# Patient Record
Sex: Female | Born: 1985 | Race: Black or African American | Hispanic: No | Marital: Married | Smoking: Never smoker
Health system: Southern US, Community
[De-identification: ages and names within clinical notes are randomized; demographics above are authoritative.]

## PROBLEM LIST (undated history)

## (undated) ENCOUNTER — Inpatient Hospital Stay (HOSPITAL_COMMUNITY): Payer: Self-pay

---

## 2015-06-23 ENCOUNTER — Inpatient Hospital Stay (HOSPITAL_COMMUNITY)
Admission: AD | Admit: 2015-06-23 | Discharge: 2015-06-23 | Disposition: A | Discharge status: Home / Self Care | Payer: Commercial Managed Care - PPO | Source: Ambulatory Visit | Attending: Obstetrics & Gynecology | Admitting: Obstetrics & Gynecology

## 2015-06-23 ENCOUNTER — Ambulatory Visit (INDEPENDENT_AMBULATORY_CARE_PROVIDER_SITE_OTHER): Payer: Commercial Managed Care - PPO | Admitting: Family Medicine

## 2015-06-23 ENCOUNTER — Encounter (HOSPITAL_COMMUNITY): Payer: Self-pay | Admitting: *Deleted

## 2015-06-23 ENCOUNTER — Inpatient Hospital Stay (HOSPITAL_COMMUNITY): Payer: Commercial Managed Care - PPO

## 2015-06-23 VITALS — BP 130/80 | HR 84 | Temp 98.5°F | Resp 16 | Ht 66.0 in | Wt 140.6 lb

## 2015-06-23 DIAGNOSIS — O26891 Other specified pregnancy related conditions, first trimester: Secondary | ICD-10-CM | POA: Insufficient documentation

## 2015-06-23 DIAGNOSIS — N831 Corpus luteum cyst: Secondary | ICD-10-CM | POA: Diagnosis not present

## 2015-06-23 DIAGNOSIS — O209 Hemorrhage in early pregnancy, unspecified: Secondary | ICD-10-CM | POA: Diagnosis not present

## 2015-06-23 DIAGNOSIS — O4690 Antepartum hemorrhage, unspecified, unspecified trimester: Secondary | ICD-10-CM

## 2015-06-23 DIAGNOSIS — R1084 Generalized abdominal pain: Secondary | ICD-10-CM

## 2015-06-23 DIAGNOSIS — Z3A01 Less than 8 weeks gestation of pregnancy: Secondary | ICD-10-CM | POA: Diagnosis not present

## 2015-06-23 DIAGNOSIS — O4691 Antepartum hemorrhage, unspecified, first trimester: Secondary | ICD-10-CM

## 2015-06-23 DIAGNOSIS — O3481 Maternal care for other abnormalities of pelvic organs, first trimester: Secondary | ICD-10-CM | POA: Diagnosis not present

## 2015-06-23 DIAGNOSIS — N760 Acute vaginitis: Secondary | ICD-10-CM | POA: Diagnosis not present

## 2015-06-23 DIAGNOSIS — A499 Bacterial infection, unspecified: Secondary | ICD-10-CM | POA: Diagnosis not present

## 2015-06-23 DIAGNOSIS — R109 Unspecified abdominal pain: Secondary | ICD-10-CM | POA: Insufficient documentation

## 2015-06-23 DIAGNOSIS — R102 Pelvic and perineal pain: Secondary | ICD-10-CM

## 2015-06-23 DIAGNOSIS — B9689 Other specified bacterial agents as the cause of diseases classified elsewhere: Secondary | ICD-10-CM

## 2015-06-23 LAB — POCT URINALYSIS DIP (MANUAL ENTRY)
Bilirubin, UA: NEGATIVE
Glucose, UA: NEGATIVE
Leukocytes, UA: NEGATIVE
NITRITE UA: NEGATIVE
SPEC GRAV UA: 1.025
UROBILINOGEN UA: 1
pH, UA: 6.5

## 2015-06-23 LAB — POCT CBC
GRANULOCYTE PERCENT: 71.3 % (ref 37–80)
HCT, POC: 41.9 % (ref 37.7–47.9)
Hemoglobin: 13.2 g/dL (ref 12.2–16.2)
LYMPH, POC: 1.6 (ref 0.6–3.4)
MCH, POC: 25.6 pg — AB (ref 27–31.2)
MCHC: 31.6 g/dL — AB (ref 31.8–35.4)
MCV: 81.2 fL (ref 80–97)
MID (cbc): 0.2 (ref 0–0.9)
MPV: 8.3 fL (ref 0–99.8)
PLATELET COUNT, POC: 219 10*3/uL (ref 142–424)
POC Granulocyte: 4.4 (ref 2–6.9)
POC LYMPH %: 25.4 % (ref 10–50)
POC MID %: 3.3 %M (ref 0–12)
RBC: 5.16 M/uL (ref 4.04–5.48)
RDW, POC: 12.9 %
WBC: 6.2 10*3/uL (ref 4.6–10.2)

## 2015-06-23 LAB — POCT WET + KOH PREP
TRICH BY WET PREP: ABSENT
Yeast by KOH: ABSENT
Yeast by wet prep: ABSENT

## 2015-06-23 LAB — POC MICROSCOPIC URINALYSIS (UMFC)

## 2015-06-23 LAB — POCT URINE PREGNANCY: Preg Test, Ur: POSITIVE — AB

## 2015-06-23 LAB — HCG, QUANTITATIVE, PREGNANCY: HCG, BETA CHAIN, QUANT, S: 66076.4 m[IU]/mL

## 2015-06-23 MED ORDER — PRENATAL MULTIVITAMIN + DHA 28-0.8 & 200 MG PO MISC: 1.0000 | Freq: Every day | ORAL | Status: DC

## 2015-06-23 MED ORDER — METRONIDAZOLE 500 MG PO TABS: 500.0000 mg | ORAL_TABLET | Freq: Two times a day (BID) | ORAL | Status: DC

## 2015-06-23 MED ORDER — METOCLOPRAMIDE HCL 5 MG PO TABS
5.0000 mg | ORAL_TABLET | Freq: Four times a day (QID) | ORAL | Status: DC | PRN
Start: 2015-06-23 — End: 2016-02-22

## 2015-06-23 NOTE — Patient Instructions (Addendum)
  [facility] ER [address] [place], [place] [address]    I am concerned that you could have a tubal pregnancy, or an ectopic pregnancy. Therefore, it would be best for you to go to the [facility] ER so you can get an ultrasound of your uterus to see if the pregnancy is in the uterus or not. If this is not a tubal pregnancy, then I am concerned you could have an infection in your uterus.  I have talked to the ER doctor and charge nurse, and they are expecting you and are aware of the situation.  They should give you a taxi voucher home.

## 2015-06-23 NOTE — MAU Note (Signed)
Pt here for ultrasound to determine if pregnancy is ectopic. Pain on her left side only. Rates pain 7/10.

## 2015-06-23 NOTE — Discharge Instructions (Signed)
Your ultrasound shows that you have a 6 week 1 day pregnancy with a heart beat. You have a cyst on the left ovary that may cause some pain. Start your prenatal care and prenatal vitamins. Return if the bleeding becomes heavy or you have other problems.

## 2015-06-23 NOTE — Progress Notes (Deleted)
Patient ID: Veronica Garrett, female   DOB: 01-Oct-1985, 29 y.o.   MRN: [id number]

## 2015-06-23 NOTE — MAU Provider Note (Signed)
Veronica Garrett is a 29 y.o. G1P0 @ [redacted]w[redacted]d who was evaluated at Urgent Care for abdominal pain and bleeding in early pregnancy and sent to MAU for ultrasound to rule out ectopic.  See note from Urgent Care Provider in [facility].   Language line used to translate because patient speaks [place].   BP 131/84 mmHg  Pulse 74  Temp(Src) 98.2 F (36.8 C) (Oral)  Resp 16  Ht  (1.651 m)  Wt 139 lb 6 oz (63.22 kg)  BMI 23.19 kg/m2  LMP 05/11/2015   Patient is alert and oriented, she appears well hydrated and is in NAD.  She has normal breathing effort and normal heart rate.   US Ob Comp Less 14 Wks  06/23/2015   CLINICAL DATA:  Mid pelvic pain for 1 week  EXAM: OBSTETRIC <14 WK [place] AND TRANSVAGINAL OB US  TECHNIQUE: Both transabdominal and transvaginal ultrasound examinations were performed for complete evaluation of the gestation as well as the maternal uterus, adnexal regions, and pelvic cul-de-sac. Transvaginal technique was performed to assess early pregnancy.  COMPARISON:  None.  FINDINGS: Intrauterine gestational sac: Visualized/normal in shape.  Yolk sac:  Present  Embryo:  Present  Cardiac Activity: Present  Heart Rate: 114  bpm  CRL:  4.6  mm   6 w   1 d                  [place] EDC: 02/15/2016  Maternal uterus/adnexae: Small focus of decreased echogenicity is noted adjacent to the gestational sac consistent with a small subchorionic hemorrhage. The ovaries are within normal limits with a corpus luteum cyst on the left measuring 1.8 cm.  IMPRESSION: Single live intrauterine gestation at 6 weeks 1 day. Small subchorionic hemorrhage. Followup is recommended as clinically indicated.   Electronically Signed   By: Alcide Clever M.D.   On: 06/23/2015 17:59   US Ob Transvaginal  06/23/2015   CLINICAL DATA:  Mid pelvic pain for 1 week  EXAM: OBSTETRIC <14 WK [place] AND TRANSVAGINAL OB US  TECHNIQUE: Both transabdominal and transvaginal ultrasound examinations were performed for complete evaluation of the  gestation as well as the maternal uterus, adnexal regions, and pelvic cul-de-sac. Transvaginal technique was performed to assess early pregnancy.  COMPARISON:  None.  FINDINGS: Intrauterine gestational sac: Visualized/normal in shape.  Yolk sac:  Present  Embryo:  Present  Cardiac Activity: Present  Heart Rate: 114  bpm  CRL:  4.6  mm   6 w   1 d                  [place] EDC: 02/15/2016  Maternal uterus/adnexae: Small focus of decreased echogenicity is noted adjacent to the gestational sac consistent with a small subchorionic hemorrhage. The ovaries are within normal limits with a corpus luteum cyst on the left measuring 1.8 cm.  IMPRESSION: Single live intrauterine gestation at 6 weeks 1 day. Small subchorionic hemorrhage. Followup is recommended as clinically indicated.   Electronically Signed   By: Alcide Clever M.D.   On: 06/23/2015 17:59    A/P 29 y.o. female @ 6.[redacted] weeks gestation with vaginal spotting and left side pain stable for d/c with viable IUP. Discussed in detail with the patient using the [place] language line translator ultrasound findings and plan of care.  IUP @ 6wk. 1 day and Left CLC. She will start her prenatal care and return as needed for any problems.

## 2015-06-23 NOTE — Progress Notes (Signed)
Subjective:    Patient ID: Veronica Garrett, female    DOB: [birth year], 29 y.o.   MRN: [id number] This chart was scribed for Veronica Sorenson, MD by Littie Deeds, Medical Scribe. This patient was seen in Room 11 and the patient's care was started at 2:08 PM.   Chief Complaint  Patient presents with  . Abdominal Pain  . Nausea    HPI HPI Comments: Veronica Garrett is a 29 y.o. female who [facility] complaining of constant mid abdominal pain that started 1 week ago. The pain is not a burning or stabbing pain. The pain is not worse with movement or eating. Patient also reports having associated nausea and vomiting from 7:00 AM to 1:00 PM. She has been unable to eat so she has been trying to drink fluids. She also notes having recurrent vaginal spotting for the past several days, mainly seen on toilet paper after urination. She has not tried any medications. Patient denies urinary symptoms. She has a distant history of pregnancy many years ago. She is planning to keep the pregnancy, but needs to discuss with the father of the baby who is not present today.  Patient's primary language is [place]. An interpreter was used to translate the history.  History reviewed. No pertinent past medical history. History reviewed. No pertinent past surgical history. No current outpatient prescriptions on file prior to visit.   No current facility-administered medications on file prior to visit.   No Known Allergies History reviewed. No pertinent family history. Social History   Social History  . Marital Status: Single    Spouse Name: N/A  . Number of Children: N/A  . Years of Education: N/A   Social History Main Topics  . Smoking status: Never Smoker   . Smokeless tobacco: None  . Alcohol Use: None  . Drug Use: None  . Sexual Activity: Not Asked   Other Topics Concern  . None   Social History Narrative  . None    Review of Systems  Gastrointestinal: Positive  for nausea, vomiting and abdominal pain.  Genitourinary: Positive for vaginal bleeding. Negative for dysuria, frequency and difficulty urinating.       Objective:  BP 130/80 mmHg  Pulse 84  Temp(Src) 98.5 F (36.9 C) (Oral)  Resp 16  Ht  (1.676 m)  Wt 140 lb 9.6 oz (63.776 kg)  BMI 22.70 kg/m2  SpO2 99%  LMP 05/11/2015  Physical Exam  Constitutional: She is oriented to person, place, and time. She appears well-developed and well-nourished. No distress.  HENT:  Head: Normocephalic and atraumatic.  Mouth/Throat: Oropharynx is clear and moist. No oropharyngeal exudate.  Eyes: Pupils are equal, round, and reactive to light.  Neck: Neck supple.  Cardiovascular: Normal rate.   Pulmonary/Chest: Effort normal.  Abdominal: Soft. Bowel sounds are normal. She exhibits no distension. There is tenderness in the left lower quadrant. There is guarding. There is no rebound.  Positive bowel sounds. LLQ tenderness with guarding. No referred pain.  Genitourinary: Uterus is tender. Uterus is not enlarged. Cervix exhibits motion tenderness. Left adnexum displays tenderness.  Severe pain with pelvic exam. Severe cervical motion tenderness. Tenderness over the uterus and left adnexa. Cervix appears pale with slightly bluish hue. Os closed. Tinge of thin clear mucoid discharge tinged with blood along vaginal walls, none seen on from cervix. No sign of bleeding from os. Uterus does not feel enlarged, no masses palpable.   Musculoskeletal: She exhibits no edema.  Neurological: She is alert and oriented to person, place, and time. No cranial nerve deficit.  Skin: Skin is warm and dry. No rash noted.  Psychiatric: She has a normal mood and affect. Her behavior is normal.  Nursing note and vitals reviewed.      Results for orders placed or performed in visit on 06/23/15  POCT urine pregnancy  Result Value Ref Range   Preg Test, Ur Positive (A) Negative  POCT urinalysis dipstick  Result Value Ref  Range   Color, UA brown (A) yellow   Clarity, UA clear clear   Glucose, UA negative negative   Bilirubin, UA negative negative   Ketones, POC UA small (15) (A) negative   Spec Grav, UA 1.025    Blood, UA trace-intact (A) negative   pH, UA 6.5    Protein Ur, POC =30 (A) negative   Urobilinogen, UA 1.0    Nitrite, UA Negative Negative   Leukocytes, UA Negative Negative  POCT Microscopic Urinalysis ([facility])  Result Value Ref Range   WBC,UR,HPF,POC Few (A) None WBC/hpf   RBC,UR,HPF,POC Few (A) None RBC/hpf   Bacteria Few (A) None   Mucus Present (A) Absent   Epithelial Cells, UR Per Microscopy Few (A) None cells/hpf  POCT Wet + KOH Prep ([facility])  Result Value Ref Range   Yeast by KOH Absent Present, Absent   Yeast by wet prep Absent Present, Absent   WBC by wet prep Few None, Few   Clue Cells Wet Prep HPF POC Moderate (A) None   Trich by wet prep Absent Present, Absent   Bacteria Wet Prep HPF POC Many (A) None, Few   Epithelial Cells By [employer] Pref ([facility]) Many (A) None, Few   RBC,UR,HPF,POC Moderate (A) None RBC/hpf  POCT CBC  Result Value Ref Range   WBC 6.2 4.6 - 10.2 K/uL   Lymph, poc 1.6 0.6 - 3.4   POC LYMPH PERCENT 25.4 10 - 50 %L   MID (cbc) 0.2 0 - 0.9   POC MID % 3.3 0 - 12 %M   POC Granulocyte 4.4 2 - 6.9   Granulocyte percent 71.3 37 - 80 %G   RBC 5.16 4.04 - 5.48 M/uL   Hemoglobin 13.2 12.2 - 16.2 g/dL   HCT, POC 40.9 81.1 - 47.9 %   MCV 81.2 80 - 97 fL   MCH, POC 25.6 (A) 27 - 31.2 pg   MCHC 31.6 (A) 31.8 - 35.4 g/dL   RDW, POC 91.4 %   Platelet Count, POC 219 142 - 424 K/uL   MPV 8.3 0 - 99.8 fL     Assessment & Plan:   1. Generalized abdominal pain   2. Pelvic pain affecting pregnancy in first trimester, antepartum - concern for tubal preg - need pelvic US stat - tried to arrange o/p but couldn't get [place] tech to call us back so will send in to ER - discussed w/ charge nurse and EDP - taxi called and paid for - pt will need taxi voucher home. Pt is kept NPO in  case she needs any urgent eval.  GC/Chlam and serum quant hcg P.   3. Bacterial vaginosis - gave rx for BV  4.      Dehydration - from n/v of pregnancy - gave rx for reglan, push fluids  Pt speak [place]. Exam was conducted with [employer]  Orders Placed This Encounter  Procedures  . GC/Chlamydia Probe Amp  . US OB Transvaginal    Standing  Status: Future     Number of Occurrences:      Standing Expiration Date: 08/22/2016    Order Specific Question:  Reason for Exam (SYMPTOM  OR DIAGNOSIS REQUIRED)    Answer:  severe cervical/uterine/left adnexa pain    Order Specific Question:  Preferred imaging location?    Answer:  [facility]  . US Pelvis Limited    Standing Status: Future     Number of Occurrences:      Standing Expiration Date: 08/22/2016    Order Specific Question:  Reason for Exam (SYMPTOM  OR DIAGNOSIS REQUIRED)    Answer:  severe cervical/uterine/left adnexa pain    Order Specific Question:  Preferred imaging location?    Answer:  [facility]  . US Pelvis Complete    Standing Status: Future     Number of Occurrences:      Standing Expiration Date: 08/22/2016    Order Specific Question:  Reason for Exam (SYMPTOM  OR DIAGNOSIS REQUIRED)    Answer:  pelvic pain/ positive HCG    Order Specific Question:  Preferred imaging location?    Answer:  [facility]  . hCG, quantitative, pregnancy  . POCT urine pregnancy  . POCT urinalysis dipstick  . POCT Microscopic Urinalysis ([facility])  . POCT Wet + KOH Prep ([facility])  . POCT CBC    Meds ordered this encounter  Medications  . Prenatal MV-Min-Fe Fum-FA-DHA (PRENATAL MULTIVITAMIN + DHA) 28-0.8 & 200 MG MISC    Sig: Take 1 tablet by mouth daily.    Dispense:  120 each    Refill:  3  . metoCLOPramide (REGLAN) 5 MG tablet    Sig: Take 1-2 tablets (5-10 mg total) by mouth every 6 (six) hours as needed for nausea or vomiting.    Dispense:  40 tablet    Refill:  1  . metroNIDAZOLE (FLAGYL) 500 MG tablet     Sig: Take 1 tablet (500 mg total) by mouth 2 (two) times daily.    Dispense:  14 tablet    Refill:  0    I personally performed the services described in this documentation, which was scribed in my presence. The recorded information has been reviewed and considered, and addended by me as needed.  Veronica Sorenson, MD MPH   By signing my name below, I, Littie Deeds, attest that this documentation has been prepared under the direction and in the presence of Veronica Sorenson, MD.  Electronically Signed: Littie Deeds, Medical Scribe. 06/23/2015. 2:08 PM.

## 2015-06-25 ENCOUNTER — Encounter: Payer: Self-pay | Admitting: Family Medicine

## 2015-06-25 LAB — GC/CHLAMYDIA PROBE AMP
CT Probe RNA: NEGATIVE
GC PROBE AMP APTIMA: NEGATIVE

## 2015-07-24 ENCOUNTER — Encounter (HOSPITAL_COMMUNITY): Payer: Self-pay | Admitting: *Deleted

## 2015-07-24 ENCOUNTER — Inpatient Hospital Stay (HOSPITAL_COMMUNITY)
Admission: AD | Admit: 2015-07-24 | Discharge: 2015-07-24 | Disposition: A | Discharge status: Home / Self Care | Payer: Commercial Managed Care - PPO | Source: Ambulatory Visit | Attending: Family Medicine | Admitting: Family Medicine

## 2015-07-24 DIAGNOSIS — O209 Hemorrhage in early pregnancy, unspecified: Secondary | ICD-10-CM | POA: Diagnosis present

## 2015-07-24 DIAGNOSIS — Z3A10 10 weeks gestation of pregnancy: Secondary | ICD-10-CM | POA: Insufficient documentation

## 2015-07-24 DIAGNOSIS — R1084 Generalized abdominal pain: Secondary | ICD-10-CM

## 2015-07-24 DIAGNOSIS — R109 Unspecified abdominal pain: Secondary | ICD-10-CM | POA: Diagnosis not present

## 2015-07-24 DIAGNOSIS — Z3491 Encounter for supervision of normal pregnancy, unspecified, first trimester: Secondary | ICD-10-CM

## 2015-07-24 LAB — URINALYSIS, ROUTINE W REFLEX MICROSCOPIC
Bilirubin Urine: NEGATIVE
GLUCOSE, UA: NEGATIVE mg/dL
Ketones, ur: 15 mg/dL — AB
LEUKOCYTES UA: NEGATIVE
Nitrite: NEGATIVE
PH: 6 (ref 5.0–8.0)
Protein, ur: NEGATIVE mg/dL
Urobilinogen, UA: 0.2 mg/dL (ref 0.0–1.0)

## 2015-07-24 LAB — URINE MICROSCOPIC-ADD ON

## 2015-07-24 NOTE — Discharge Instructions (Signed)
Prenatal Care °WHAT IS PRENATAL CARE?  °Prenatal care is the process of caring for a pregnant woman before she gives birth. Prenatal care makes sure that she and her baby remain as healthy as possible throughout pregnancy. Prenatal care may be provided by a midwife, family practice health care provider, or a childbirth and pregnancy specialist (obstetrician). Prenatal care may include physical examinations, testing, treatments, and education on nutrition, lifestyle, and social support services. °WHY IS PRENATAL CARE SO IMPORTANT?  °Early and consistent prenatal care increases the chance that you and your baby will remain healthy throughout your pregnancy. This type of care also decreases a baby's risk of being born too early (prematurely), or being born smaller than expected (small for gestational age). Any underlying medical conditions you may have that could pose a risk during your pregnancy are discussed during prenatal care visits. You will also be monitored regularly for any new conditions that may arise during your pregnancy so they can be treated quickly and effectively. °WHAT HAPPENS DURING PRENATAL CARE VISITS? °Prenatal care visits may include the following: °Discussion °Tell your health care provider about any new signs or symptoms you have experienced since your last visit. These might include: °· Nausea or vomiting. °· Increased or decreased level of energy. °· Difficulty sleeping. °· Back or leg pain. °· Weight changes. °· Frequent urination. °· Shortness of breath with physical activity. °· Changes in your skin, such as the development of a rash or itchiness. °· Vaginal discharge or bleeding. °· Feelings of excitement or nervousness. °· Changes in your baby's movements. °You may want to write down any questions or topics you want to discuss with your health care provider and bring them with you to your appointment. °Examination °During your first prenatal care visit, you will likely have a complete  physical exam. Your health care provider will often examine your vagina, cervix, and the position of your uterus, as well as check your heart, lungs, and other body systems. As your pregnancy progresses, your health care provider will measure the size of your uterus and your baby's position inside your uterus. He or she may also examine you for early signs of labor. Your prenatal visits may also include checking your blood pressure and, after about 10-12 weeks of pregnancy, listening to your baby's heartbeat. °Testing °Regular testing often includes: °· Urinalysis. This checks your urine for glucose, protein, or signs of infection. °· Blood count. This checks the levels of white and red blood cells in your body. °· Tests for sexually transmitted infections (STIs). Testing for STIs at the beginning of pregnancy is routinely done and is required in many states. °· Antibody testing. You will be checked to see if you are immune to certain illnesses, such as rubella, that can affect a developing fetus. °· Glucose screen. Around 24-28 weeks of pregnancy, your blood glucose level will be checked for signs of gestational diabetes. Follow-up tests may be recommended. °· Group B strep. This is a bacteria that is commonly found inside a woman's vagina. This test will inform your health care provider if you need an antibiotic to reduce the amount of this bacteria in your body prior to labor and childbirth. °· Ultrasound. Many pregnant women undergo an ultrasound screening around 18-20 weeks of pregnancy to evaluate the health of the fetus and check for any developmental abnormalities. °· HIV (human immunodeficiency virus) testing. Early in your pregnancy, you will be screened for HIV. If you are at high risk for HIV, this test   may be repeated during your third trimester of pregnancy. °You may be offered other testing based on your age, personal or family medical history, or other factors.  °HOW OFTEN SHOULD I PLAN TO SEE MY  HEALTH CARE PROVIDER FOR PRENATAL CARE? °Your prenatal care check-up schedule depends on any medical conditions you have before, or develop during, your pregnancy. If you do not have any underlying medical conditions, you will likely be seen for checkups: °· Monthly, during the first 6 months of pregnancy. °· Twice a month during months 7 and 8 of pregnancy. °· Weekly starting in the 9th month of pregnancy and until delivery. °If you develop signs of early labor or other concerning signs or symptoms, you may need to see your health care provider more often. Ask your health care provider what prenatal care schedule is best for you. °WHAT CAN I DO TO KEEP MYSELF AND MY BABY AS HEALTHY AS POSSIBLE DURING MY PREGNANCY? °· Take a prenatal vitamin containing 400 micrograms (0.4 mg) of folic acid every day. Your health care provider may also ask you to take additional vitamins such as iodine, vitamin D, iron, copper, and zinc. °· Take 1500-2000 mg of calcium daily starting at your 20th week of pregnancy until you deliver your baby. °· Make sure you are up to date on your vaccinations. Unless directed otherwise by your health care provider: °¨ You should receive a tetanus, diphtheria, and pertussis (Tdap) vaccination between the 27th and 36th week of your pregnancy, regardless of when your last Tdap immunization occurred. This helps protect your baby from whooping cough (pertussis) after he or she is born. °¨ You should receive an annual inactivated influenza vaccine (IIV) to help protect you and your baby from influenza. This can be done at any point during your pregnancy. °· Eat a well-rounded diet that includes: °¨ Fresh fruits and vegetables. °¨ Lean proteins. °¨ Calcium-rich foods such as milk, yogurt, hard cheeses, and dark, leafy greens. °¨ Whole grain breads. °· Do not eat seafood high in mercury, including: °¨ Swordfish. °¨ Tilefish. °¨ Shark. °¨ King mackerel. °¨ More than 6 oz tuna per week. °· Do not eat: °¨ Raw  or undercooked meats or eggs. °¨ Unpasteurized foods, such as soft cheeses (brie, blue, or feta), juices, and milks. °¨ Lunch meats. °¨ Hot dogs that have not been heated until they are steaming. °· Drink enough water to keep your urine clear or pale yellow. For many women, this may be 10 or more 8 oz glasses of water each day. Keeping yourself hydrated helps deliver nutrients to your baby and may prevent the start of pre-term uterine contractions. °· Do not use any tobacco products including cigarettes, chewing tobacco, or electronic cigarettes. If you need help quitting, ask your health care provider. °· Do not drink beverages containing alcohol. No safe level of alcohol consumption during pregnancy has been determined. °· Do not use any illegal drugs. These can harm your developing baby or cause a miscarriage. °· Ask your health care provider or pharmacist before taking any prescription or over-the-counter medicines, herbs, or supplements. °· Limit your caffeine intake to no more than 200 mg per day. °· Exercise. Unless told otherwise by your health care provider, try to get 30 minutes of moderate exercise most days of the week. Do not  do high-impact activities, contact sports, or activities with a high risk of falling, such as horseback riding or downhill skiing. °· Get plenty of rest. °· Avoid anything that raises your   body temperature, such as hot tubs and saunas. °· If you own a cat, do not empty its litter box. Bacteria contained in cat feces can cause an infection called toxoplasmosis. This can result in serious harm to the fetus. °· Stay away from chemicals such as insecticides, lead, mercury, and cleaning or paint products that contain solvents. °· Do not have any X-rays taken unless medically necessary. °· Take a childbirth and breastfeeding preparation class. Ask your health care provider if you need a referral or recommendation. °  °This information is not intended to replace advice given to you by  your health care provider. Make sure you discuss any questions you have with your health care provider. °  °Document Released: 09/17/2003 Document Revised: 10/05/2014 Document Reviewed: 11/29/2013 °Elsevier Interactive Patient Education ©2016 [employer] ° °

## 2015-07-24 NOTE — MAU Note (Addendum)
Seen for vaginal bleeding and cramping in MAU on 06/23/15 and is back for check-up; given rxs for reglan,flagyl &  prenatal vitamin; does not have an OB yet; pt is spitting today and c/o of mild cramping that started this past Saturday; translator needed for communication;

## 2015-07-24 NOTE — MAU Provider Note (Signed)
History     CSN: [id number]  Arrival date and time: 07/24/15 1107   First Provider Initiated Contact with Patient 07/24/15 1203      Chief Complaint  Patient presents with  . Abdominal Pain   HPI Veronica Garrett 29 y.o. G1P0 @[redacted]w[redacted]d  presents for evaluation of pregnancy.  She has no new complaints and reports the pain she previously had is some better.  She denies fever, weakness, vaginal bleeding, dysuria.  She was concerned after hearing her U/S report from last visit (a month ago) that she should follow up.  [facility].  She does not know where to go.  She has used her medications provided at last visit correctly: Metronidazole, Reglan and PNV.   OB History    Gravida Para Term Preterm AB TAB SAB Ectopic Multiple Living   1               History reviewed. No pertinent past medical history.  History reviewed. No pertinent past surgical history.  Family History  Problem Relation Age of Onset  . Alcohol abuse Neg Hx   . Asthma Neg Hx   . Arthritis Neg Hx   . Birth defects Neg Hx   . Cancer Neg Hx   . COPD Neg Hx   . Depression Neg Hx   . Diabetes Neg Hx   . Drug abuse Neg Hx   . Early death Neg Hx   . Hearing loss Neg Hx   . Heart disease Neg Hx   . Hyperlipidemia Neg Hx   . Hypertension Neg Hx   . Kidney disease Neg Hx   . Learning disabilities Neg Hx   . Mental illness Neg Hx   . Mental retardation Neg Hx   . Miscarriages / Stillbirths Neg Hx   . Vision loss Neg Hx   . Varicose Veins Neg Hx   . Stroke Neg Hx     Social History  Substance Use Topics  . Smoking status: Never Smoker   . Smokeless tobacco: None  . Alcohol Use: None    Allergies: No Known Allergies  Prescriptions prior to admission  Medication Sig Dispense Refill Last Dose  . metoCLOPramide (REGLAN) 5 MG tablet Take 1-2 tablets (5-10 mg total) by mouth every 6 (six) hours as needed for nausea or vomiting. 40 tablet 1   . metroNIDAZOLE (FLAGYL) 500 MG tablet Take 1 tablet  (500 mg total) by mouth 2 (two) times daily. 14 tablet 0   . Prenatal MV-Min-Fe Fum-FA-DHA (PRENATAL MULTIVITAMIN + DHA) 28-0.8 & 200 MG MISC Take 1 tablet by mouth daily. 120 each 3     ROS Pertinent ROS in HPI.  All other systems are negative.   Physical Exam   Blood pressure 133/73, pulse 93, temperature 98 F (36.7 C), temperature source Oral, resp. rate 18, height 5\' 5"  (1.651 m), weight 140 lb (63.504 kg), last menstrual period 05/11/2015.  Physical Exam  Constitutional: She is oriented to person, place, and time. She appears well-developed and well-nourished. No distress.  Eyes: Conjunctivae and EOM are normal.  Cardiovascular: Normal rate.   Respiratory: Effort normal. No respiratory distress.  Musculoskeletal: Normal range of motion. She exhibits no edema.  Neurological: She is alert and oriented to person, place, and time.  Skin: Skin is warm and dry.  Psychiatric: She has a normal mood and affect. Her behavior is normal.    MAU Course  Procedures  MDM U/S last month with IUP.  NO  bleeding, and pain previously reported is improved.  Pt states no new complaint.   No need for emergent workup  Assessment and Plan  A:  1. First trimester pregnancy    P: Discharge to home Continue PNV Obtain [facility] asap - list of providers given to pt Precautione given - return for severe pain, vaginal bleeding, etc.   Patient may return to MAU as needed or if her condition were to change or worsen Visit conducted with [place] Interpreter from the language line.  Pt indicates good understanding and all questions answered.  Duane Boston Clark 07/24/2015, 12:13 PM

## 2015-07-24 NOTE — MAU Note (Signed)
Pt presents to MAU with complaints of lower abdominal pain since yesterday. Denies any vaginal bleeding

## 2015-09-12 LAB — OB RESULTS CONSOLE HEPATITIS B SURFACE ANTIGEN: Hepatitis B Surface Ag: NEGATIVE

## 2015-09-12 LAB — OB RESULTS CONSOLE ABO/RH: RH TYPE: POSITIVE

## 2015-09-12 LAB — OB RESULTS CONSOLE RUBELLA ANTIBODY, IGM: Rubella: IMMUNE

## 2015-09-12 LAB — OB RESULTS CONSOLE GC/CHLAMYDIA
CHLAMYDIA, DNA PROBE: NEGATIVE
GC PROBE AMP, GENITAL: NEGATIVE

## 2015-09-12 LAB — OB RESULTS CONSOLE ANTIBODY SCREEN: Antibody Screen: NEGATIVE

## 2015-09-12 LAB — OB RESULTS CONSOLE HIV ANTIBODY (ROUTINE TESTING): HIV: NONREACTIVE

## 2015-09-12 LAB — OB RESULTS CONSOLE RPR: RPR: NONREACTIVE

## 2015-09-29 NOTE — L&D Delivery Note (Signed)
Delivery Note At 12:39 PM a viable female was delivered via Vaginal, Spontaneous Delivery (Presentation:vertex ;Left Occiput Anterior).  APGAR: 8, 9; weight  .   Placenta status:spont ,vis shultz .  Cord:3vc  with the following complications:mild shoulder dystocia resolved with Lysle DingwallMc Roberts and superpubic pressure. Event lasted 30 second. .  Cord pH: pending  Anesthesia: None  Episiotomy:  none Lacerations:  none Suture Repair: none Est. Blood Loss 100(mL):    Mom to postpartum.  Baby to Couplet care / Skin to Skin.  Veronica Garrett 02/23/2016, 12:54 PM

## 2016-01-20 LAB — OB RESULTS CONSOLE GBS: GBS: POSITIVE

## 2016-01-20 LAB — OB RESULTS CONSOLE GC/CHLAMYDIA
Chlamydia: NEGATIVE
Gonorrhea: NEGATIVE

## 2016-02-17 ENCOUNTER — Telehealth (HOSPITAL_COMMUNITY): Payer: Self-pay | Admitting: *Deleted

## 2016-02-17 ENCOUNTER — Encounter (HOSPITAL_COMMUNITY): Payer: Self-pay | Admitting: *Deleted

## 2016-02-17 NOTE — Telephone Encounter (Signed)
Preadmission screen Interpreter number [id number] 

## 2016-02-22 ENCOUNTER — Encounter (HOSPITAL_COMMUNITY): Payer: Self-pay

## 2016-02-22 ENCOUNTER — Inpatient Hospital Stay (HOSPITAL_COMMUNITY)
Admission: RE | Admit: 2016-02-22 | Discharge: 2016-02-25 | DRG: 775 | Disposition: A | Discharge status: Home / Self Care | Payer: Medicaid Other | Source: Ambulatory Visit | Attending: Family Medicine | Admitting: Family Medicine

## 2016-02-22 DIAGNOSIS — Z3A41 41 weeks gestation of pregnancy: Secondary | ICD-10-CM

## 2016-02-22 DIAGNOSIS — O480 Post-term pregnancy: Secondary | ICD-10-CM | POA: Diagnosis present

## 2016-02-22 DIAGNOSIS — O99824 Streptococcus B carrier state complicating childbirth: Secondary | ICD-10-CM | POA: Diagnosis present

## 2016-02-22 LAB — COMPREHENSIVE METABOLIC PANEL
ALK PHOS: 177 U/L — AB (ref 38–126)
ALT: 23 U/L (ref 14–54)
AST: 34 U/L (ref 15–41)
Albumin: 3.6 g/dL (ref 3.5–5.0)
Anion gap: 9 (ref 5–15)
BILIRUBIN TOTAL: 0.9 mg/dL (ref 0.3–1.2)
BUN: 6 mg/dL (ref 6–20)
CALCIUM: 9.7 mg/dL (ref 8.9–10.3)
CO2: 23 mmol/L (ref 22–32)
CREATININE: 0.51 mg/dL (ref 0.44–1.00)
Chloride: 104 mmol/L (ref 101–111)
Glucose, Bld: 81 mg/dL (ref 65–99)
Potassium: 3.8 mmol/L (ref 3.5–5.1)
Sodium: 136 mmol/L (ref 135–145)
Total Protein: 7 g/dL (ref 6.5–8.1)

## 2016-02-22 LAB — CBC
HCT: 37.9 % (ref 36.0–46.0)
HEMOGLOBIN: 13 g/dL (ref 12.0–15.0)
MCH: 28.4 pg (ref 26.0–34.0)
MCHC: 34.3 g/dL (ref 30.0–36.0)
MCV: 82.8 fL (ref 78.0–100.0)
PLATELETS: 150 10*3/uL (ref 150–400)
RBC: 4.58 MIL/uL (ref 3.87–5.11)
RDW: 13.2 % (ref 11.5–15.5)
WBC: 8.3 10*3/uL (ref 4.0–10.5)

## 2016-02-22 LAB — ABO/RH: ABO/RH(D): O POS

## 2016-02-22 LAB — PROTEIN / CREATININE RATIO, URINE
CREATININE, URINE: 24 mg/dL
Protein Creatinine Ratio: 0.38 mg/mg{Cre} — ABNORMAL HIGH (ref 0.00–0.15)
TOTAL PROTEIN, URINE: 9 mg/dL

## 2016-02-22 LAB — TYPE AND SCREEN
ABO/RH(D): O POS
Antibody Screen: NEGATIVE

## 2016-02-22 MED ORDER — OXYTOCIN 40 UNITS IN LACTATED RINGERS INFUSION - SIMPLE MED
2.5000 [IU]/h | INTRAVENOUS | Status: DC
  Administered 2016-02-23: 2.5 [IU]/h via INTRAVENOUS
  Filled 2016-02-22: qty 1000

## 2016-02-22 MED ORDER — ACETAMINOPHEN 325 MG PO TABS: 650.0000 mg | ORAL_TABLET | ORAL | Status: DC | PRN

## 2016-02-22 MED ORDER — PENICILLIN G POTASSIUM 5000000 UNITS IJ SOLR
5.0000 10*6.[IU] | Freq: Once | INTRAMUSCULAR | Status: AC
  Administered 2016-02-22: 5 10*6.[IU] via INTRAVENOUS
  Filled 2016-02-22: qty 5

## 2016-02-22 MED ORDER — OXYTOCIN BOLUS FROM INFUSION
500.0000 mL | INTRAVENOUS | Status: DC
  Administered 2016-02-23: 500 mL via INTRAVENOUS

## 2016-02-22 MED ORDER — LACTATED RINGERS IV SOLN
INTRAVENOUS | Status: DC
  Administered 2016-02-22 (×2): via INTRAVENOUS
  Administered 2016-02-23: 125 mL/h via INTRAVENOUS

## 2016-02-22 MED ORDER — LACTATED RINGERS IV SOLN: 500.0000 mL | INTRAVENOUS | Status: DC | PRN

## 2016-02-22 MED ORDER — PENICILLIN G POTASSIUM 5000000 UNITS IJ SOLR
2.5000 10*6.[IU] | INTRAVENOUS | Status: DC
  Filled 2016-02-22 (×3): qty 2.5

## 2016-02-22 MED ORDER — SOD CITRATE-CITRIC ACID 500-334 MG/5ML PO SOLN
30.0000 mL | ORAL | Status: DC | PRN
  Filled 2016-02-22: qty 15

## 2016-02-22 MED ORDER — LIDOCAINE HCL (PF) 1 % IJ SOLN
30.0000 mL | INTRAMUSCULAR | Status: DC | PRN
  Filled 2016-02-22: qty 30

## 2016-02-22 MED ORDER — OXYCODONE-ACETAMINOPHEN 5-325 MG PO TABS: 2.0000 | ORAL_TABLET | ORAL | Status: DC | PRN

## 2016-02-22 MED ORDER — ONDANSETRON HCL 4 MG/2ML IJ SOLN: 4.0000 mg | Freq: Four times a day (QID) | INTRAMUSCULAR | Status: DC | PRN

## 2016-02-22 MED ORDER — MISOPROSTOL 25 MCG QUARTER TABLET
25.0000 ug | ORAL_TABLET | ORAL | Status: DC | PRN
  Administered 2016-02-22 (×2): 25 ug via VAGINAL
  Filled 2016-02-22 (×3): qty 0.25

## 2016-02-22 MED ORDER — FENTANYL CITRATE (PF) 100 MCG/2ML IJ SOLN
100.0000 ug | INTRAMUSCULAR | Status: DC | PRN
  Administered 2016-02-23 (×2): 100 ug via INTRAVENOUS
  Filled 2016-02-22 (×3): qty 2

## 2016-02-22 MED ORDER — TERBUTALINE SULFATE 1 MG/ML IJ SOLN
0.2500 mg | Freq: Once | INTRAMUSCULAR | Status: DC | PRN
  Filled 2016-02-22: qty 1

## 2016-02-22 MED ORDER — OXYCODONE-ACETAMINOPHEN 5-325 MG PO TABS: 1.0000 | ORAL_TABLET | ORAL | Status: DC | PRN

## 2016-02-22 MED ORDER — PENICILLIN G POTASSIUM 5000000 UNITS IJ SOLR
2.5000 10*6.[IU] | INTRAVENOUS | Status: DC
  Administered 2016-02-22 – 2016-02-23 (×3): 2.5 10*6.[IU] via INTRAVENOUS
  Filled 2016-02-22 (×6): qty 2.5

## 2016-02-22 NOTE — Progress Notes (Signed)
Patient ID: Veronica Garrett, female   DOB: [birth year], 30 y.o.   MRN: [id number] Veronica Garrett is a 30 y.o. G1P0 at 5446w0d admitted for induction of labor due to Post dates. Due date 02/15/16.  Subjective: Doing well, no complaints  Objective: BP 114/68 mmHg  Pulse 85  Temp(Src) 98.2 F (36.8 C) (Oral)  Resp 16  Ht 5\' 5"  (1.651 m)  Wt 74.844 kg (165 lb)  BMI 27.46 kg/m2  SpO2 100%  LMP 05/11/2015    FHT:  FHR: 135 bpm, variability: moderate,  accelerations:  Present,  decelerations:  Absent UC:   irregular, every 2-5 minutes  SVE:   Dilation: Fingertip Effacement (%): Thick Station: -2 Exam by:: Grace BushyBooker, CNM  Cytotec 25mcg placed in posterior vaginal fornix  Labs: Lab Results  Component Value Date   WBC 8.3 02/22/2016   HGB 13.0 02/22/2016   HCT 37.9 02/22/2016   MCV 82.8 02/22/2016   PLT 150 02/22/2016    Assessment / Plan: 2nd cytotec placed, plan for foley bulb when able  Labor: cervical ripening phase Fetal Wellbeing:  Category I Pain Control:  n/a Pre-eclampsia: n/a I/D:  pcn w/ labor/rom/pit Anticipated MOD:  NSVD  Marge DuncansBooker, Aziah Brostrom Randall CNM, WHNP-BC 02/22/2016, 1:33 PM

## 2016-02-22 NOTE — H&P (Signed)
Veronica Garrett is a 30 y.o. G1P0 female at 3845w0d by 6wk u/s, presenting for IOL d/t postdates.   Reports active fetal movement, contractions: some mild uc's, vaginal bleeding: none, membranes: intact. [facility] w[facility].   Denies ha, visual changes, ruq/epigastric pain, n/v.    This pregnancy complicated by: Gbs+, postdates, language barrier- [place]  Prenatal History/Complications:  none  Past Medical History: History reviewed. No pertinent past medical history.  Past Surgical History: History reviewed. No pertinent past surgical history.  Obstetrical History: OB History    Gravida Para Term Preterm AB TAB SAB Ectopic Multiple Living   1               Social History: Social History   Social History  . Marital Status: Married    Spouse Name: N/A  . Number of Children: N/A  . Years of Education: N/A   Social History Main Topics  . Smoking status: Never Smoker   . Smokeless tobacco: Never Used  . Alcohol Use: No  . Drug Use: No  . Sexual Activity: Not Asked   Other Topics Concern  . None   Social History Narrative    Family History: Family History  Problem Relation Age of Onset  . Alcohol abuse Neg Hx   . Asthma Neg Hx   . Arthritis Neg Hx   . Birth defects Neg Hx   . Cancer Neg Hx   . COPD Neg Hx   . Depression Neg Hx   . Diabetes Neg Hx   . Drug abuse Neg Hx   . Early death Neg Hx   . Hearing loss Neg Hx   . Heart disease Neg Hx   . Hyperlipidemia Neg Hx   . Hypertension Neg Hx   . Kidney disease Neg Hx   . Learning disabilities Neg Hx   . Mental illness Neg Hx   . Mental retardation Neg Hx   . Miscarriages / Stillbirths Neg Hx   . Vision loss Neg Hx   . Varicose Veins Neg Hx   . Stroke Neg Hx     Allergies: No Known Allergies  Prescriptions prior to admission  Medication Sig Dispense Refill Last Dose  . metoCLOPramide (REGLAN) 5 MG tablet Take 1-2 tablets (5-10 mg total) by mouth every 6 (six) hours as needed for  nausea or vomiting. 40 tablet 1   . Prenatal MV-Min-Fe Fum-FA-DHA (PRENATAL MULTIVITAMIN + DHA) 28-0.8 & 200 MG MISC Take 1 tablet by mouth daily. 120 each 3     Review of Systems  Pertinent pos/neg as indicated in HPI  Blood pressure 141/85, pulse 99, temperature 98.1 F (36.7 C), temperature source Oral, resp. rate 18, height 5\' 5"  (1.651 m), last menstrual period 05/11/2015. General appearance: alert, cooperative and no distress Lungs: clear to auscultation bilaterally Heart: regular rate and rhythm Abdomen: gravid, soft, non-tender Extremities: No edema DTR's 2+ No clonus  Fetal monitoring: FHR: 150 bpm, variability: moderate,  Accelerations: Present,  decelerations:  Absent Uterine activity: irregular   SVE: cl/th/-2, posterior Cytotec 25mcg placed in posterior fornix Presentation: cephalic   Prenatal labs: ABO, Rh: O/Positive/-- (12/15 0000) Antibody: Negative (12/15 0000) Rubella: !Error! RPR: Nonreactive (12/15 0000)  HBsAg: Negative (12/15 0000)  HIV: Non-reactive (12/15 0000)  GBS: Positive (04/24 0000)   1 hr Glucola: Early 78, Repeat 105 Genetic screening:  Quad neg Anatomy US: normal female 'Morrie Sheldonshley'  No results found for this or any previous visit (from the past 24  hour(s)).   Assessment:  [redacted]w[redacted]d SIUP  G1P0  IOL d/t postdates  Mildly elevated bp w/o sx  Cat 1 FHR  GBS Positive (04/24 0000)  Plan:  Admit to BS  IV pain meds/epidural prn active labor  Cytotec q4hr until able to place cervical foley bulb  Get pre-e labs  Anticipate NSVB    Marge Duncans CNM, WHNP-BC 02/22/2016, 8:47 AM

## 2016-02-22 NOTE — Anesthesia Pain Management Evaluation Note (Signed)
  CRNA Pain Management Visit Note  Patient: Veronica Garrett, 30 y.o., female  "Hello I am a member of the anesthesia team at [facility]. We have an anesthesia team available at all times to provide care throughout the hospital, including epidural management and anesthesia for C-section. I don't know your plan for the delivery whether it a natural birth, water birth, IV sedation, nitrous supplementation, doula or epidural, but we want to meet your pain goals."   1.Was your pain managed to your expectations on prior hospitalizations?   No prior hospitalizations  2.What is your expectation for pain management during this hospitalization?     Labor support without medications  3.How can we help you reach that goal? Natural birth  Record the patient's initial score and the patient's pain goal.   Pain: 0  Pain Goal: 10 [facility] wants you to be able to say your pain was always managed very well.  Patrisha Hausmann 02/22/2016

## 2016-02-22 NOTE — Progress Notes (Signed)
Patient ID: Veronica Garrett, female   DOB: [birth year], 30 y.o.   MRN: [id number] Veronica Garrett is a 30 y.o. G1P0 at 6361w0d admitted for induction of labor due to Post dates. Due date 5/20.  Subjective: Uncomfortable w/ uc's, saw some bloody show when wiping  Objective: BP 114/68 mmHg  Pulse 85  Temp(Src) 98.2 F (36.8 C) (Oral)  Resp 16  Ht 5\' 5"  (1.651 m)  Wt 74.844 kg (165 lb)  BMI 27.46 kg/m2  SpO2 100%  LMP 05/11/2015    FHT:  FHR: 150 bpm, variability: moderate,  accelerations:  Present,  decelerations:  Absent UC:   regular, every 2-3 minutes  SVE:   Dilation: 2 Effacement (%): 80 Station: -2 Exam by:: Karyn Brull, CNM Posterior  Labs: Lab Results  Component Value Date   WBC 8.3 02/22/2016   HGB 13.0 02/22/2016   HCT 37.9 02/22/2016   MCV 82.8 02/22/2016   PLT 150 02/22/2016    Assessment / Plan: IOL d/t postdates, s/p cytotec x 2, regular uncomfortable uc's w/ good cervical change, will observe for now  Labor: Progressing normally Fetal Wellbeing:  Category I Pain Control:  Labor support without medications Pre-eclampsia: P:C ratio 0.38, remainder labs normal, bp's stable I/D:  pcn for gbs + Anticipated MOD:  NSVD  Veronica Garrett, Veronica Garrett CNM, WHNP-BC 02/22/2016, 6:48 PM

## 2016-02-23 ENCOUNTER — Encounter (HOSPITAL_COMMUNITY): Payer: Self-pay

## 2016-02-23 DIAGNOSIS — O99824 Streptococcus B carrier state complicating childbirth: Secondary | ICD-10-CM

## 2016-02-23 DIAGNOSIS — O480 Post-term pregnancy: Secondary | ICD-10-CM

## 2016-02-23 DIAGNOSIS — Z3A41 41 weeks gestation of pregnancy: Secondary | ICD-10-CM

## 2016-02-23 LAB — RPR: RPR: NONREACTIVE

## 2016-02-23 MED ORDER — EPHEDRINE 5 MG/ML INJ
10.0000 mg | INTRAVENOUS | Status: DC | PRN
Start: 2016-02-23 — End: 2016-02-23

## 2016-02-23 MED ORDER — ONDANSETRON HCL 4 MG/2ML IJ SOLN: 4.0000 mg | INTRAMUSCULAR | Status: DC | PRN

## 2016-02-23 MED ORDER — OXYTOCIN 40 UNITS IN LACTATED RINGERS INFUSION - SIMPLE MED
1.0000 m[IU]/min | INTRAVENOUS | Status: DC
  Administered 2016-02-23: 4 m[IU]/min via INTRAVENOUS
  Administered 2016-02-23: 2 m[IU]/min via INTRAVENOUS

## 2016-02-23 MED ORDER — FENTANYL 2.5 MCG/ML BUPIVACAINE 1/10 % EPIDURAL INFUSION (WH - ANES): 14.0000 mL/h | INTRAMUSCULAR | Status: DC | PRN

## 2016-02-23 MED ORDER — SIMETHICONE 80 MG PO CHEW: 80.0000 mg | CHEWABLE_TABLET | ORAL | Status: DC | PRN

## 2016-02-23 MED ORDER — TETANUS-DIPHTH-ACELL PERTUSSIS 5-2.5-18.5 LF-MCG/0.5 IM SUSP: 0.5000 mL | Freq: Once | INTRAMUSCULAR | Status: DC

## 2016-02-23 MED ORDER — LACTATED RINGERS IV SOLN: 500.0000 mL | Freq: Once | INTRAVENOUS | Status: DC

## 2016-02-23 MED ORDER — COCONUT OIL OIL: 1.0000 "application " | TOPICAL_OIL | Status: DC | PRN

## 2016-02-23 MED ORDER — SODIUM CHLORIDE 0.9% FLUSH
3.0000 mL | Freq: Two times a day (BID) | INTRAVENOUS | Status: DC
Start: 2016-02-23 — End: 2016-02-25

## 2016-02-23 MED ORDER — PRENATAL MULTIVITAMIN CH
1.0000 | ORAL_TABLET | Freq: Every day | ORAL | Status: DC
  Administered 2016-02-24: 1 via ORAL
  Filled 2016-02-23: qty 1

## 2016-02-23 MED ORDER — MEASLES, MUMPS & RUBELLA VAC ~~LOC~~ INJ
0.5000 mL | INJECTION | Freq: Once | SUBCUTANEOUS | Status: DC
  Filled 2016-02-23: qty 0.5

## 2016-02-23 MED ORDER — ONDANSETRON HCL 4 MG PO TABS: 4.0000 mg | ORAL_TABLET | ORAL | Status: DC | PRN

## 2016-02-23 MED ORDER — DIPHENHYDRAMINE HCL 25 MG PO CAPS: 25.0000 mg | ORAL_CAPSULE | Freq: Four times a day (QID) | ORAL | Status: DC | PRN

## 2016-02-23 MED ORDER — WITCH HAZEL-GLYCERIN EX PADS: 1.0000 "application " | MEDICATED_PAD | CUTANEOUS | Status: DC | PRN

## 2016-02-23 MED ORDER — PHENYLEPHRINE 40 MCG/ML (10ML) SYRINGE FOR IV PUSH (FOR BLOOD PRESSURE SUPPORT)
80.0000 ug | PREFILLED_SYRINGE | INTRAVENOUS | Status: DC | PRN
Start: 2016-02-23 — End: 2016-02-23

## 2016-02-23 MED ORDER — TERBUTALINE SULFATE 1 MG/ML IJ SOLN: 0.2500 mg | Freq: Once | INTRAMUSCULAR | Status: DC | PRN

## 2016-02-23 MED ORDER — ACETAMINOPHEN 325 MG PO TABS: 650.0000 mg | ORAL_TABLET | ORAL | Status: DC | PRN

## 2016-02-23 MED ORDER — SODIUM CHLORIDE 0.9% FLUSH
3.0000 mL | INTRAVENOUS | Status: DC | PRN
  Administered 2016-02-23: 3 mL via INTRAVENOUS
  Filled 2016-02-23: qty 3

## 2016-02-23 MED ORDER — PHENYLEPHRINE 40 MCG/ML (10ML) SYRINGE FOR IV PUSH (FOR BLOOD PRESSURE SUPPORT): 80.0000 ug | PREFILLED_SYRINGE | INTRAVENOUS | Status: DC | PRN

## 2016-02-23 MED ORDER — DIPHENHYDRAMINE HCL 50 MG/ML IJ SOLN: 12.5000 mg | INTRAMUSCULAR | Status: DC | PRN

## 2016-02-23 MED ORDER — ZOLPIDEM TARTRATE 5 MG PO TABS: 5.0000 mg | ORAL_TABLET | Freq: Every evening | ORAL | Status: DC | PRN

## 2016-02-23 MED ORDER — IBUPROFEN 600 MG PO TABS
600.0000 mg | ORAL_TABLET | Freq: Four times a day (QID) | ORAL | Status: DC
Start: 2016-02-23 — End: 2016-02-25
  Administered 2016-02-23 – 2016-02-25 (×7): 600 mg via ORAL
  Filled 2016-02-23 (×7): qty 1

## 2016-02-23 MED ORDER — BENZOCAINE-MENTHOL 20-0.5 % EX AERO
1.0000 "application " | INHALATION_SPRAY | CUTANEOUS | Status: DC | PRN
  Administered 2016-02-23: 1 via TOPICAL
  Filled 2016-02-23: qty 56

## 2016-02-23 MED ORDER — SODIUM CHLORIDE 0.9 % IV SOLN: 250.0000 mL | INTRAVENOUS | Status: DC | PRN

## 2016-02-23 MED ORDER — DIBUCAINE 1 % RE OINT: 1.0000 "application " | TOPICAL_OINTMENT | RECTAL | Status: DC | PRN

## 2016-02-23 MED ORDER — SENNOSIDES-DOCUSATE SODIUM 8.6-50 MG PO TABS
2.0000 | ORAL_TABLET | ORAL | Status: DC
  Administered 2016-02-23 – 2016-02-24 (×2): 2 via ORAL
  Filled 2016-02-23 (×2): qty 2

## 2016-02-23 MED ORDER — EPHEDRINE 5 MG/ML INJ: 10.0000 mg | INTRAVENOUS | Status: DC | PRN

## 2016-02-23 NOTE — Progress Notes (Addendum)
Patient ID: Veronica Garrett, female   DOB: 06-25-1986, 30 y.o.   MRN: [id number] Veronica Garrett is a 30 y.o. G1P0 at 4533w1d admitted for induction of labor due to Post dates. Due date 5/20.  Subjective: Breathing w/ uc's. RN walked in and found her eating regular diet  Objective: BP 125/78 mmHg  Pulse 75  Temp(Src) 98.5 F (36.9 C) (Oral)  Resp 18  Ht 5\' 5"  (1.651 m)  Wt 74.844 kg (165 lb)  BMI 27.46 kg/m2  SpO2 100%  LMP 05/11/2015    FHT:  FHR: 135 bpm, variability: moderate,  accelerations:  Present,  decelerations:  Absent UC:   regular, every 2-3 minutes  SVE:   Dilation: 4.5 Effacement (%): 70 Station: -2 Exam by:: ansah-mensah, rnc    Labs: Lab Results  Component Value Date   WBC 8.3 02/22/2016   HGB 13.0 02/22/2016   HCT 37.9 02/22/2016   MCV 82.8 02/22/2016   PLT 150 02/22/2016    Assessment / Plan: IOL d/t postdates, progressing well off of cytotec x 2, clear liquids only  Labor: Progressing normally Fetal Wellbeing:  Category I Pain Control:  Labor support without medications Pre-eclampsia: bp's stable I/D:  pcn for gbs+ Anticipated MOD:  NSVD  Marge DuncansBooker, Quinnley Colasurdo Randall CNM, WHNP-BC 02/23/2016, 2:31 AM

## 2016-02-23 NOTE — Progress Notes (Signed)
Discussed options for pain relief with pt and husband

## 2016-02-23 NOTE — Progress Notes (Signed)
Labor Progress Note Lysle DingwallBakoubamana Walczyk is a 30 y.o. G1P0 at 3742w1d admitted for IOL due to Post dates. Due date 5/20.  S: Notes increased pain/intensity of contractions. No N/V.  O:  BP 130/86 mmHg  Pulse 75  Temp(Src) 98.2 F (36.8 C) (Oral)  Resp 20  Ht 5\' 5"  (1.651 m)  Wt 74.844 kg (165 lb)  BMI 27.46 kg/m2  SpO2 100%  LMP 05/11/2015 General: alert, cooperative and no distress Chest: normal WOB Heart: Regular rate/rhythm, no murmurs Pelvic: See below. Extremities: trace edema EFM: 150/mod/+accels/no decels  CVE: Dilation: 4.5 Effacement (%): 70 Cervical Position: Posterior Station: -2 Presentation: Vertex Exam by:: ansah-mensah, rnc    A&P: 30 y.o. G1P0 2542w1d here for IOL for postdates. #Labor: Progressing well s/p cytotec x2 #Pain: Increasing intensity with contractions, requests labor support w/o medications #FWB: Category I strip #GBS: pos, PCN given #Anticipated MOD: NSVD  Barnie Mortolleen Dymin Dingledine, Med Student 1:35 AM

## 2016-02-23 NOTE — Progress Notes (Signed)
Veronica Garrett is a 30 y.o. G1P0 at 7036w1d admitted for induction of labor due to Post dates.  Subjective: Notes some pain, but would like to proceed without pain medication. Breathing through contractions.  Objective: BP 108/65 mmHg  Pulse 76  Temp(Src) 98 F (36.7 C) (Oral)  Resp 18  Ht 5\' 5"  (1.651 m)  Wt 165 lb (74.844 kg)  BMI 27.46 kg/m2  SpO2 100%  LMP 05/11/2015      FHT:  FHR: 140 bpm, variability: moderate,  accelerations:  Present,  decelerations:  Absent UC:   Regular SVE:   Dilation: 5.5 Effacement (%): 80 Station: -2 Exam by:: Veronica Garrett, rnc   Labs: Lab Results  Component Value Date   WBC 8.3 02/22/2016   HGB 13.0 02/22/2016   HCT 37.9 02/22/2016   MCV 82.8 02/22/2016   PLT 150 02/22/2016    Assessment / Plan: Induction of labor due to postterm,  progressing well on pitocin  Labor: Progressing normally. Consider AROM if stops progressing. Fetal Wellbeing:  Category I Pain Control:  Labor support without medications I/D:  GBS positive. PCN. Anticipated MOD:  NSVD  [facility] Delmus Warwick 02/23/2016, 5:32 AM

## 2016-02-23 NOTE — Progress Notes (Signed)
Veronica Garrett is a 30 y.o. G1P0 at 1546w1d by ultrasound admitted for induction of labor due to Post dates. Due date 5/22.  Subjective:   Objective: BP 135/93 mmHg  Pulse 79  Temp(Src) 98.2 F (36.8 C) (Oral)  Resp 16  Ht 5\' 5"  (1.651 m)  Wt 165 lb (74.844 kg)  BMI 27.46 kg/m2  SpO2 100%  LMP 05/11/2015      FHT:  FHR: 140's bpm, variability: moderate,  accelerations:  Present,  decelerations:  Absent UC:   irregular, every 4-7 minutes SVE:   Dilation: 6 Effacement (%): 90 Station: -2 Exam by:: Genella RifeK. Booker, cnm  Labs: Lab Results  Component Value Date   WBC 8.3 02/22/2016   HGB 13.0 02/22/2016   HCT 37.9 02/22/2016   MCV 82.8 02/22/2016   PLT 150 02/22/2016    Assessment / Plan: Spontaneous labor, progressing normally  Labor: Progressing normally Preeclampsia:  no signs or symptoms of toxicity and intake and ouput balanced Fetal Wellbeing:  Category I Pain Control:  Labor support without medications I/D:  n/a Anticipated MOD:  NSVD  Wyvonnia DuskyMarie Verneda Hollopeter 02/23/2016, 8:47 AM

## 2016-02-23 NOTE — Progress Notes (Signed)
Interpreter on phone.  Instructed pt she needs tio empty bladder and questions regarding pain medication.  Pt up to bathroom with assist and refused pain medication at this time

## 2016-02-23 NOTE — Progress Notes (Signed)
Found pt eating, informed not to eat. Importance of not eating regular diet explained to pt via interpreter [phone]. Also explained to pt about pain management options. Pt very uncomfortable but refused meds or nitrous at this time.

## 2016-02-23 NOTE — Progress Notes (Signed)
Patient ID: Veronica Garrett, female   DOB: [birth year], 30 y.o.   MRN: [id number] Veronica DingwallBakoubamana Arkwright is a 30 y.o. G1P0 at 3719w1d admitted for induction of labor due to Post dates. Due date 5/20.  Subjective: Uncomfortable w/ uc's, breathing  Objective: BP 108/65 mmHg  Pulse 76  Temp(Src) 98 F (36.7 C) (Oral)  Resp 18  Ht 5\' 5"  (1.651 m)  Wt 74.844 kg (165 lb)  BMI 27.46 kg/m2  SpO2 100%  LMP 05/11/2015    FHT:  FHR: 135 bpm, variability: moderate,  accelerations:  Present,  decelerations:  Absent UC:   regular, every 2-3 minutes  SVE:   6/90/-2, BBOW AROM sm amt light MSF  Labs: Lab Results  Component Value Date   WBC 8.3 02/22/2016   HGB 13.0 02/22/2016   HCT 37.9 02/22/2016   MCV 82.8 02/22/2016   PLT 150 02/22/2016    Assessment / Plan: IOL d/t postdates, now arom'd  Labor: Progressing normally Fetal Wellbeing:  Category I Pain Control:  Labor support without medications Pre-eclampsia: w/o severe features, bp's stable I/D: pcn for gbs+ Anticipated MOD:  NSVD  Marge DuncansBooker, Mylon Mabey Randall CNM, WHNP-BC 02/23/2016, 6:32 AM

## 2016-02-24 NOTE — Progress Notes (Signed)
UR chart review completed.  

## 2016-02-24 NOTE — Lactation Note (Signed)
This note was copied from a baby's chart. Lactation Consultation Note Follow up visit at 31 hours of age.  With french interpreter [phone] via [employer].  Mom attempting feeding now with very shallow latch.  LC assisted with breaking latch and showing mom how to wait for wide open mouth.  Baby flanged mouth wide with deep latch and strong rhythmic sucking.  Stimulation needed to maintain feeding and baby continues on breast.  LC encouraged mom to independently latch baby several times and wait for wide open mouth, mom continues to need assistance.  Mom reports improved pain with improved depth of latch.  Encouraged mom to hand express pre and post feedings and apply EBM to nipples.    Mom denies further concerns at this time.  Patient Name: Veronica Garrett Tennis EAVWU'JToday's Date: 02/24/2016 Reason for consult: Follow-up assessment;Breast/nipple pain;Difficult latch   Maternal Data Has patient been taught Hand Expression?: Yes  Feeding Feeding Type: Breast Fed Length of feed:  (15 minutes observed of good feeding)  LATCH Score/Interventions Latch: Repeated attempts needed to sustain latch, nipple held in mouth throughout feeding, stimulation needed to elicit sucking reflex. Intervention(s): Skin to skin;Teach feeding cues;Waking techniques Intervention(s): Breast compression;Breast massage;Assist with latch;Adjust position  Audible Swallowing: A few with stimulation Intervention(s): Skin to skin;Hand expression  Type of Nipple: Everted at rest and after stimulation  Comfort (Breast/Nipple): Filling, red/small blisters or bruises, mild/mod discomfort  Problem noted: Mild/Moderate discomfort  Hold (Positioning): Assistance needed to correctly position infant at breast and maintain latch. Intervention(s): Breastfeeding basics reviewed;Support Pillows;Position options;Skin to skin  LATCH Score: 6  Lactation Tools Discussed/Used Tools: Shells   Consult Status Consult Status:  Follow-up Date: 02/25/16 Follow-up type: In-patient    Jannifer RodneyShoptaw, Jana Lynn 02/24/2016, 8:05 PM

## 2016-02-24 NOTE — Lactation Note (Signed)
This note was copied from a baby's chart. Lactation Consultation Note New mom speaks [place]. Language line [place] interpreter used [phone]. Mom can answer some things, intense teaching, needs interpreter. Mom has pendulum heavy breast, small everted nipples. Encouraged to wear bra. Has short shaft. Everts more w/stimulation. Areola not very compressible, ? Edema. Shells given to assist in everting nipples. Mom applied bra w/shells. Gave hand pump to evert nipples and stimulate breast. Hand expression taught w/glistening of colostrum. Breast tender, increased in bra size during pregnancy. Baby has no interest in BF at this time. Was spitting clear fluid when entered room. Abd. Slightly distended. Gagging again before leaving rm. demonstrated suction bulb. Mom encouraged to feed baby 8-12 times/24 hours and with feeding cues. Referred to Baby and Me Book in Breastfeeding section Pg. 22-23 for position options and Proper latch demonstration. Educated about newborn behavior, encouraged STS, I&O, stimulating to feed, cluster feeding, supply and demand. WH/LC brochure given w/resources, support groups and LC services. Reported to RN. Patient Name: Veronica Garrett UEAVW'UToday's Date: 02/24/2016 Reason for consult: Initial assessment   Maternal Data    Feeding Feeding Type: Breast Fed Length of feed: 0 min  LATCH Score/Interventions Latch: Too sleepy or reluctant, no latch achieved, no sucking elicited. Intervention(s): Teach feeding cues;Waking techniques;Skin to skin  Audible Swallowing: None Intervention(s): Skin to skin;Hand expression  Type of Nipple: Everted at rest and after stimulation (short shaft) Intervention(s): Reverse pressure;Shells;Hand pump  Comfort (Breast/Nipple): Soft / non-tender     Hold (Positioning): Assistance needed to correctly position infant at breast and maintain latch. Intervention(s): Breastfeeding basics reviewed;Support Pillows;Position options;Skin to  skin  LATCH Score: 5  Lactation Tools Discussed/Used Tools: Shells;Pump Shell Type: Inverted Breast pump type: Manual WIC Program: Yes Pump Review: Setup, frequency, and cleaning;Milk Storage Initiated by:: Peri JeffersonL. Garey Alleva RN, IBCLC Date initiated:: 02/24/16   Consult Status Consult Status: Follow-up Date: 02/24/16 Follow-up type: In-patient    Charyl DancerCARVER, Jaiel Saraceno G 02/24/2016, 6:49 AM

## 2016-02-24 NOTE — Progress Notes (Signed)
Post Partum Day 1 Subjective: no complaints, up ad lib, voiding, tolerating PO and + flatus  Objective: Blood pressure 122/70, pulse 75, temperature 98.3 F (36.8 C), temperature source Oral, resp. rate 20, height 5\' 5"  (1.651 m), weight 165 lb (74.844 kg), last menstrual period 05/11/2015, SpO2 100 %, unknown if currently breastfeeding.  Physical Exam:  General: alert, cooperative, appears stated age and no distress Lochia: appropriate Uterine Fundus: firm Incision: n/a DVT Evaluation: Negative Homan's sign. No cords or calf tenderness. No significant calf/ankle edema.   Recent Labs  02/22/16 0830  HGB 13.0  HCT 37.9    Assessment/Plan: Plan for discharge tomorrow   LOS: 2 days   Veronica Garrett 02/24/2016, 7:23 AM

## 2016-02-25 MED ORDER — IBUPROFEN 600 MG PO TABS: 600.0000 mg | ORAL_TABLET | Freq: Four times a day (QID) | ORAL | Status: DC

## 2016-02-25 MED ORDER — ACETAMINOPHEN 325 MG PO TABS: 650.0000 mg | ORAL_TABLET | ORAL | Status: DC | PRN

## 2016-02-25 NOTE — Discharge Summary (Signed)
OB Discharge Summary     Patient Name: Veronica Garrett DOB: 02-08-1986 MRN: [id number]  Date of admission: 02/22/2016 Delivering MD: Wyvonnia Dusky D   Date of discharge: 02/25/2016  Admitting diagnosis: postterm induction Intrauterine pregnancy: [redacted]w[redacted]d     Secondary diagnosis:  Active Problems:   Post term pregnancy   NSVD (normal spontaneous vaginal delivery)  Additional problems: none     Discharge diagnosis: Term Pregnancy Delivered                                                                                                Post partum procedures:none  Augmentation: Pitocin and Cytotec  Complications: None  Hospital course:  Induction of Labor With Vaginal Delivery   30 y.o. yo G1P1001 at [redacted]w[redacted]d was admitted to the hospital 02/22/2016 for induction of labor.  Indication for induction: Postdates.  Patient had an uncomplicated labor course as follows: Membrane Rupture Time/Date: 6:30 AM ,02/23/2016   Intrapartum Procedures: Episiotomy: None [1]                                         Lacerations:  None [1]  Patient had delivery of a Viable infant.  Information for the patient's newborn:  Veronica, Garrett [[id number]]  Delivery Method: Vaginal, Spontaneous Delivery (Filed from Delivery Summary)   02/23/2016  Details of delivery can be found in separate delivery note. Report of shoulder dystocia lasting 30sec is described.  Patient had a routine postpartum course. Patient is discharged home 02/25/2016.   Physical exam  Filed Vitals:   02/23/16 2004 02/24/16 0508 02/24/16 1800 02/25/16 0530  BP: 133/76 122/70 132/75 126/78  Pulse: 72 75 78 75  Temp: 98.4 F (36.9 C) 98.3 F (36.8 C) 98.3 F (36.8 C) 98.4 F (36.9 C)  TempSrc: Oral Oral Oral Oral  Resp: Height:      Weight:      SpO2:       General: alert, cooperative and no distress Lochia: appropriate Uterine Fundus: firm Incision: N/A DVT Evaluation: No evidence of DVT seen on  physical exam. Labs: Lab Results  Component Value Date   WBC 8.3 02/22/2016   HGB 13.0 02/22/2016   HCT 37.9 02/22/2016   MCV 82.8 02/22/2016   PLT 150 02/22/2016   CMP Latest Ref Rng 02/22/2016  Glucose 65 - 99 mg/dL 81  BUN 6 - 20 mg/dL 6  Creatinine 7.82 - 9.56 mg/dL 2.13  Sodium 086 - 578 mmol/L 136  Potassium 3.5 - 5.1 mmol/L 3.8  Chloride 101 - 111 mmol/L 104  CO2 22 - 32 mmol/L 23  Calcium 8.9 - 10.3 mg/dL 9.7  Total Protein 6.5 - 8.1 g/dL 7.0  Total Bilirubin 0.3 - 1.2 mg/dL 0.9  Alkaline Phos 38 - 126 U/L 177(H)  AST 15 - 41 U/L 34  ALT 14 - 54 U/L 23    Discharge instruction: per After Visit Summary and "Baby and Me Booklet".  After visit meds:  Medication List    TAKE these medications        acetaminophen 325 MG tablet  Commonly known as:  TYLENOL  Take 2 tablets (650 mg total) by mouth every 4 (four) hours as needed (for pain scale < 4).     ibuprofen 600 MG tablet  Commonly known as:  ADVIL,MOTRIN  Take 1 tablet (600 mg total) by mouth every 6 (six) hours.     PRENATAL MULTIVITAMIN + DHA 28-0.8 & 200 MG Misc  Take 1 tablet by mouth daily.        Diet: routine diet  Activity: Advance as tolerated. Pelvic rest for 6 weeks.   Outpatient follow up:6 weeks Follow up Appt:No future appointments. Follow up Visit:No Follow-up on file.  Postpartum contraception: Condoms  Newborn Data: Live born female  Birth Weight: 7 lb 3 oz (3260 g) APGAR: 8, 9  Baby Feeding: Breast Disposition:home with mother   02/25/2016 Veronica Herculesaye T Gonfa, MD  [employer] Interpretor ID: [id number] was used for encounter.  CNM attestation I have seen and examined this patient and agree with above documentation in the resident's note.   Veronica Garrett is a 30 y.o. G1P1001 s/p SVD.   Pain is well controlled.  Plan for birth control is condoms.  Method of Feeding: breast  PE:  BP 126/78 mmHg  Pulse 75  Temp(Src) 98.4 F (36.9 C) (Oral)  Resp 19  Ht 5\' 5"  (1.651 m)  Wt  74.844 kg (165 lb)  BMI 27.46 kg/m2  SpO2 100%  LMP 05/11/2015  Breastfeeding? Unknown Fundus firm  No results for input(s): HGB, HCT in the last 72 hours.   Plan: discharge today - postpartum care discussed - f/u clinic in 6 weeks for postpartum visit   Cam HaiSHAW, KIMBERLY, CNM 9:03 AM  02/25/2016

## 2016-02-25 NOTE — Lactation Note (Signed)
This note was copied from a baby's chart. Lactation Consultation Note: Mom getting ready to latch baby as I went into room. Breasts are feeling fuller today. Baby latched well in football hold nursed for 10 min on left breast. Breast softer after nursing. Latched well to right breast and still nursing as I left room.  [place] interpreter Veronica Garrett [phone] Mom asking about when she can offer bottles- encouraged to wait 3 Galan Ghee. Has manual pump- reviewed use and cleaning of pump parts with parents. No further questions at present.   Patient Name: Veronica Garrett [id number]'JToday's Date: 02/25/2016 Reason for consult: Follow-up assessment   Maternal Data Formula Feeding for Exclusion: No Has patient been taught Hand Expression?: Yes Does the patient have breastfeeding experience prior to this delivery?: No  Feeding Feeding Type: Breast Fed  LATCH Score/Interventions Latch: Grasps breast easily, tongue down, lips flanged, rhythmical sucking.  Audible Swallowing: Spontaneous and intermittent  Type of Nipple: Everted at rest and after stimulation  Comfort (Breast/Nipple): Soft / non-tender     Hold (Positioning): Assistance needed to correctly position infant at breast and maintain latch. Intervention(s): Breastfeeding basics reviewed  LATCH Score: 9  Lactation Tools Discussed/Used     Consult Status Consult Status: Complete    Pamelia HoitWeeks, Kazoua Gossen D 02/25/2016, 11:05 AM

## 2016-02-25 NOTE — Discharge Instructions (Signed)

## 2017-06-01 ENCOUNTER — Inpatient Hospital Stay (HOSPITAL_COMMUNITY)
Admission: AD | Admit: 2017-06-01 | Discharge: 2017-06-01 | Disposition: A | Discharge status: Home / Self Care | Payer: Medicaid Other | Source: Ambulatory Visit | Attending: Family Medicine | Admitting: Family Medicine

## 2017-06-01 ENCOUNTER — Encounter (HOSPITAL_COMMUNITY): Payer: Self-pay | Admitting: *Deleted

## 2017-06-01 ENCOUNTER — Inpatient Hospital Stay (HOSPITAL_COMMUNITY): Payer: Medicaid Other

## 2017-06-01 DIAGNOSIS — O26891 Other specified pregnancy related conditions, first trimester: Secondary | ICD-10-CM | POA: Diagnosis present

## 2017-06-01 DIAGNOSIS — Z79899 Other long term (current) drug therapy: Secondary | ICD-10-CM | POA: Diagnosis not present

## 2017-06-01 DIAGNOSIS — O4691 Antepartum hemorrhage, unspecified, first trimester: Secondary | ICD-10-CM | POA: Diagnosis not present

## 2017-06-01 DIAGNOSIS — Z3A12 12 weeks gestation of pregnancy: Secondary | ICD-10-CM | POA: Diagnosis not present

## 2017-06-01 DIAGNOSIS — Z3481 Encounter for supervision of other normal pregnancy, first trimester: Secondary | ICD-10-CM

## 2017-06-01 DIAGNOSIS — O4690 Antepartum hemorrhage, unspecified, unspecified trimester: Secondary | ICD-10-CM | POA: Diagnosis not present

## 2017-06-01 LAB — URINALYSIS, ROUTINE W REFLEX MICROSCOPIC
BILIRUBIN URINE: NEGATIVE
GLUCOSE, UA: NEGATIVE mg/dL
HGB URINE DIPSTICK: NEGATIVE
KETONES UR: NEGATIVE mg/dL
Leukocytes, UA: NEGATIVE
Nitrite: NEGATIVE
PH: 6 (ref 5.0–8.0)
Protein, ur: NEGATIVE mg/dL
Specific Gravity, Urine: 1.019 (ref 1.005–1.030)

## 2017-06-01 LAB — POCT PREGNANCY, URINE: Preg Test, Ur: POSITIVE — AB

## 2017-06-01 MED ORDER — PRENATAL VITAMINS 0.8 MG PO TABS: 1.0000 | ORAL_TABLET | Freq: Every day | ORAL | 12 refills | Status: DC

## 2017-06-01 NOTE — Discharge Instructions (Signed)

## 2017-06-01 NOTE — MAU Provider Note (Signed)
History     CSN: [id number]  Arrival date and time: 06/01/17 1157   None     No chief complaint on file.  HPI  31 yo G2P1001 at 9w by LMP here for the evaluation of vaginal bleeding. Patient reports vaginal bleeding for the past 10 days. She describes the bleeding as dark blood, spotting, only noticeable when she wipes. No vaginal bleeding today. Patient is without any other complaints   OB History    Gravida Para Term Preterm AB Living   2 1 1     1    SAB TAB Ectopic Multiple Live Births         0 1      No past medical history on file.  No past surgical history on file.  Family History  Problem Relation Age of Onset  . Alcohol abuse Neg Hx   . Asthma Neg Hx   . Arthritis Neg Hx   . Birth defects Neg Hx   . Cancer Neg Hx   . COPD Neg Hx   . Depression Neg Hx   . Diabetes Neg Hx   . Drug abuse Neg Hx   . Early death Neg Hx   . Hearing loss Neg Hx   . Heart disease Neg Hx   . Hyperlipidemia Neg Hx   . Hypertension Neg Hx   . Kidney disease Neg Hx   . Learning disabilities Neg Hx   . Mental illness Neg Hx   . Mental retardation Neg Hx   . Miscarriages / Stillbirths Neg Hx   . Vision loss Neg Hx   . Varicose Veins Neg Hx   . Stroke Neg Hx     Social History  Substance Use Topics  . Smoking status: Never Smoker  . Smokeless tobacco: Never Used  . Alcohol use No    Allergies: No Known Allergies  Prescriptions Prior to Admission  Medication Sig Dispense Refill Last Dose  . acetaminophen (TYLENOL) 325 MG tablet Take 2 tablets (650 mg total) by mouth every 4 (four) hours as needed (for pain scale < 4). 20 tablet 0   . ibuprofen (ADVIL,MOTRIN) 600 MG tablet Take 1 tablet (600 mg total) by mouth every 6 (six) hours. 30 tablet 0   . Prenatal MV-Min-Fe Fum-FA-DHA (PRENATAL MULTIVITAMIN + DHA) 28-0.8 & 200 MG MISC Take 1 tablet by mouth daily. (Patient taking differently: Take 1 tablet by mouth daily. ) 120 each 3 02/21/2016 at Unknown time    Review of Systems   See pertinent in HPI Physical Exam   Blood pressure 116/62, pulse 65, temperature 99.4 F (37.4 C), temperature source Oral, resp. rate 16, weight 153 lb 8 oz (69.6 kg), last menstrual period 03/27/2017, SpO2 100 %, unknown if currently breastfeeding.  Physical Exam GENERAL: Well-developed, well-nourished female in no acute distress.  ABDOMEN: Soft, nontender, nondistended. No organomegaly. PELVIC: Normal external female genitalia. Vagina is pink and rugated.  Normal discharge. Normal appearing cervix. Uterus is normal in size. No adnexal mass or tenderness. EXTREMITIES: No cyanosis, clubbing, or edema, 2+ distal pulses.  MAU Course  Procedures  MDM Results for orders placed or performed during the hospital encounter of 06/01/17 (from the past 24 hour(s))  Urinalysis, Routine w reflex microscopic     Status: None   Collection Time: 06/01/17 12:35 PM  Result Value Ref Range   Color, Urine YELLOW YELLOW   APPearance CLEAR CLEAR   Specific Gravity, Urine 1.019 1.005 - 1.030   pH 6.0 5.0 -  8.0   Glucose, UA NEGATIVE NEGATIVE mg/dL   Hgb urine dipstick NEGATIVE NEGATIVE   Bilirubin Urine NEGATIVE NEGATIVE   Ketones, ur NEGATIVE NEGATIVE mg/dL   Protein, ur NEGATIVE NEGATIVE mg/dL   Nitrite NEGATIVE NEGATIVE   Leukocytes, UA NEGATIVE NEGATIVE  Pregnancy, urine POC     Status: Abnormal   Collection Time: 06/01/17 12:55 PM  Result Value Ref Range   Preg Test, Ur POSITIVE (A) NEGATIVE   [place] Ob Comp Less 14 Wks  Result Date: 06/01/2017 CLINICAL DATA:  First trimester vaginal bleeding. Gestational age by LMP of 9 weeks 3 days. EXAM: OBSTETRIC <14 WK ULTRASOUND TECHNIQUE: Transabdominal ultrasound was performed for evaluation of the gestation as well as the maternal uterus and adnexal regions. COMPARISON:  None. FINDINGS: Intrauterine gestational sac: Single Yolk sac:  Not Visualized. Embryo:  Visualized. Cardiac Activity: Visualized. Heart Rate: 155 bpm CRL:   61  mm   12 w 4 d                   US EDC: 12/10/2017 Subchorionic hemorrhage:  None visualized. Maternal uterus/adnexae: Small right ovarian corpus luteum noted. Otherwise normal appearance of both ovaries. No mass or abnormal free fluid identified. IMPRESSION: Single living IUP measuring 12 weeks 4 days, with US EDC of 12/10/2017. Electronically Signed   By: Myles RosenthalJohn  Stahl M.D.   On: 06/01/2017 14:09    Assessment and Plan  31 yo G2P1001 with 4246w4d IUP - Ultrasound results reviewed with the patient - Precautions reviewed - Patient to start prenatal care- scheduled for 9/17 with health department - Patient to continue prenatal vitamin  Veronica Garrett 06/01/2017, 3:11 PM

## 2017-06-01 NOTE — MAU Note (Signed)
+  [facility], started bleeding 10 days ago. Very dark, sees when she wipes, small clots. No pain today.

## 2017-06-17 LAB — OB RESULTS CONSOLE RPR: RPR: NONREACTIVE

## 2017-06-17 LAB — OB RESULTS CONSOLE ABO/RH: RH Type: POSITIVE

## 2017-06-17 LAB — OB RESULTS CONSOLE RUBELLA ANTIBODY, IGM: Rubella: IMMUNE

## 2017-06-17 LAB — OB RESULTS CONSOLE ANTIBODY SCREEN: Antibody Screen: NEGATIVE

## 2017-06-17 LAB — OB RESULTS CONSOLE HEPATITIS B SURFACE ANTIGEN: Hepatitis B Surface Ag: NEGATIVE

## 2017-06-17 LAB — OB RESULTS CONSOLE GC/CHLAMYDIA
Chlamydia: NEGATIVE
GC PROBE AMP, GENITAL: NEGATIVE

## 2017-06-17 LAB — OB RESULTS CONSOLE HIV ANTIBODY (ROUTINE TESTING): HIV: NONREACTIVE

## 2017-09-28 NOTE — L&D Delivery Note (Signed)
Obstetrical Delivery Note   Date of Delivery:   12/18/2017 [facility] Gestational Age/[facility]D: 2172w1d Reason for Admission: Post dates IOL Antepartum complications: history of shoulder dystocia. Language barrier  Delivered By:   Nolene EbbsJulie Degele, MD  Delivery Type:   spontaneous vaginal delivery  Delivery Details:   Patient easily delivered infant over one contraction. Anesthesia:    local Intrapartum complications: None GBS:    Positive and she received adequate prophylaxis Laceration:    Left peri-uretheral repaired with 3-0 plain gut Episiotomy:    none Rectal exam:   deferred Placenta:    Delivered and expressed via active management. Intact: yes. To pathology: no.  Delayed Cord Clamping: yes Estimated Blood Loss:  200mL  Baby:    Liveborn female, APGARs 8/9, weight 3124gm  Veronica Garrett, Jr. MD Attending Center for [facility](Faculty Practice)

## 2017-11-11 LAB — OB RESULTS CONSOLE GC/CHLAMYDIA
Chlamydia: NEGATIVE
GC PROBE AMP, GENITAL: NEGATIVE

## 2017-11-11 LAB — OB RESULTS CONSOLE GBS: GBS: POSITIVE

## 2017-12-10 ENCOUNTER — Telehealth (HOSPITAL_COMMUNITY): Payer: Self-pay | Admitting: *Deleted

## 2017-12-10 NOTE — Telephone Encounter (Signed)
Preadmission screen Interpreter number [phone]

## 2017-12-10 NOTE — Telephone Encounter (Signed)
Preadmission screen  

## 2017-12-14 ENCOUNTER — Other Ambulatory Visit: Payer: Self-pay | Admitting: Obstetrics and Gynecology

## 2017-12-17 ENCOUNTER — Other Ambulatory Visit: Payer: Self-pay

## 2017-12-17 ENCOUNTER — Encounter (HOSPITAL_COMMUNITY): Payer: Self-pay

## 2017-12-17 ENCOUNTER — Inpatient Hospital Stay (HOSPITAL_COMMUNITY)
Admission: RE | Admit: 2017-12-17 | Discharge: 2017-12-20 | DRG: 807 | Disposition: A | Discharge status: Home / Self Care | Payer: Medicaid Other | Source: Ambulatory Visit | Attending: Obstetrics and Gynecology | Admitting: Obstetrics and Gynecology

## 2017-12-17 DIAGNOSIS — O480 Post-term pregnancy: Principal | ICD-10-CM | POA: Diagnosis present

## 2017-12-17 DIAGNOSIS — Z758 Other problems related to medical facilities and other health care: Secondary | ICD-10-CM | POA: Diagnosis present

## 2017-12-17 DIAGNOSIS — O99824 Streptococcus B carrier state complicating childbirth: Secondary | ICD-10-CM | POA: Diagnosis present

## 2017-12-17 DIAGNOSIS — Z3A41 41 weeks gestation of pregnancy: Secondary | ICD-10-CM | POA: Diagnosis not present

## 2017-12-17 DIAGNOSIS — Z789 Other specified health status: Secondary | ICD-10-CM | POA: Diagnosis present

## 2017-12-17 DIAGNOSIS — Z8759 Personal history of other complications of pregnancy, childbirth and the puerperium: Secondary | ICD-10-CM

## 2017-12-17 LAB — CBC
HCT: 36.9 % (ref 36.0–46.0)
HEMOGLOBIN: 12.6 g/dL (ref 12.0–15.0)
MCH: 28.5 pg (ref 26.0–34.0)
MCHC: 34.1 g/dL (ref 30.0–36.0)
MCV: 83.5 fL (ref 78.0–100.0)
Platelets: 168 10*3/uL (ref 150–400)
RBC: 4.42 MIL/uL (ref 3.87–5.11)
RDW: 13.2 % (ref 11.5–15.5)
WBC: 7.9 10*3/uL (ref 4.0–10.5)

## 2017-12-17 LAB — TYPE AND SCREEN
ABO/RH(D): O POS
ANTIBODY SCREEN: NEGATIVE

## 2017-12-17 MED ORDER — TERBUTALINE SULFATE 1 MG/ML IJ SOLN
0.2500 mg | Freq: Once | INTRAMUSCULAR | Status: DC | PRN
  Filled 2017-12-17: qty 1

## 2017-12-17 MED ORDER — LACTATED RINGERS IV SOLN: 500.0000 mL | INTRAVENOUS | Status: DC | PRN

## 2017-12-17 MED ORDER — LIDOCAINE HCL (PF) 1 % IJ SOLN
30.0000 mL | INTRAMUSCULAR | Status: AC | PRN
  Administered 2017-12-18: 30 mL via SUBCUTANEOUS
  Filled 2017-12-17: qty 30

## 2017-12-17 MED ORDER — PENICILLIN G POT IN DEXTROSE 60000 UNIT/ML IV SOLN
3.0000 10*6.[IU] | INTRAVENOUS | Status: DC
  Administered 2017-12-17 (×3): 3 10*6.[IU] via INTRAVENOUS
  Filled 2017-12-17 (×7): qty 50

## 2017-12-17 MED ORDER — LACTATED RINGERS IV SOLN
INTRAVENOUS | Status: DC
  Administered 2017-12-17 (×2): via INTRAVENOUS

## 2017-12-17 MED ORDER — ONDANSETRON HCL 4 MG/2ML IJ SOLN: 4.0000 mg | Freq: Four times a day (QID) | INTRAMUSCULAR | Status: DC | PRN

## 2017-12-17 MED ORDER — OXYCODONE-ACETAMINOPHEN 5-325 MG PO TABS: 1.0000 | ORAL_TABLET | ORAL | Status: DC | PRN

## 2017-12-17 MED ORDER — FENTANYL CITRATE (PF) 100 MCG/2ML IJ SOLN: 100.0000 ug | INTRAMUSCULAR | Status: DC | PRN

## 2017-12-17 MED ORDER — OXYTOCIN 40 UNITS IN LACTATED RINGERS INFUSION - SIMPLE MED
2.5000 [IU]/h | INTRAVENOUS | Status: DC
  Administered 2017-12-18: 2.5 [IU]/h via INTRAVENOUS
  Filled 2017-12-17: qty 1000

## 2017-12-17 MED ORDER — OXYTOCIN BOLUS FROM INFUSION
500.0000 mL | Freq: Once | INTRAVENOUS | Status: AC
  Administered 2017-12-18: 500 mL via INTRAVENOUS

## 2017-12-17 MED ORDER — SODIUM CHLORIDE 0.9 % IV SOLN
5.0000 10*6.[IU] | Freq: Once | INTRAVENOUS | Status: AC
  Administered 2017-12-17: 5 10*6.[IU] via INTRAVENOUS
  Filled 2017-12-17: qty 5

## 2017-12-17 MED ORDER — OXYTOCIN 40 UNITS IN LACTATED RINGERS INFUSION - SIMPLE MED
1.0000 m[IU]/min | INTRAVENOUS | Status: DC
  Administered 2017-12-17: 2 m[IU]/min via INTRAVENOUS

## 2017-12-17 MED ORDER — MISOPROSTOL 25 MCG QUARTER TABLET
25.0000 ug | ORAL_TABLET | ORAL | Status: DC | PRN
Start: 2017-12-17 — End: 2017-12-18
  Administered 2017-12-17 (×2): 25 ug via VAGINAL
  Filled 2017-12-17 (×5): qty 1

## 2017-12-17 MED ORDER — ACETAMINOPHEN 325 MG PO TABS: 650.0000 mg | ORAL_TABLET | ORAL | Status: DC | PRN

## 2017-12-17 MED ORDER — SOD CITRATE-CITRIC ACID 500-334 MG/5ML PO SOLN
30.0000 mL | ORAL | Status: DC | PRN
Start: 2017-12-17 — End: 2017-12-18

## 2017-12-17 MED ORDER — OXYCODONE-ACETAMINOPHEN 5-325 MG PO TABS: 2.0000 | ORAL_TABLET | ORAL | Status: DC | PRN

## 2017-12-17 NOTE — Progress Notes (Signed)
Veronica Garrett is a 32 y.o. G2P1001 at 3827w0d admitted for induction of labor due to Post dates. Due date 12/10/17.  Subjective:  Coping well. Does not want any pain medication at this time.   Objective: BP (!) 144/85   Pulse 93   Temp 98.2 F (36.8 C) (Oral)   Resp 20   Ht 5\' 6"  (1.676 m)   Wt 173 lb (78.5 kg)   LMP 03/27/2017   BMI 27.92 kg/m  No intake/output data recorded. No intake/output data recorded.  FHT:  FHR: 145 bpm, variability: moderate,  accelerations:  Present,  decelerations:  Absent UC:   regular, every 2 minutes SVE:   Dilation: 5.5 Effacement (%): 100 Station: -1 Exam by:: Veronica Garrett, CNM  Labs: Lab Results  Component Value Date   WBC 7.9 12/17/2017   HGB 12.6 12/17/2017   HCT 36.9 12/17/2017   MCV 83.5 12/17/2017   PLT 168 12/17/2017    Assessment / Plan: Induction of labor due to postterm,  progressing well on pitocin  Labor: Progressing normally and AROM at this time for clear fluid  Preeclampsia:  NA Fetal Wellbeing:  Category I Pain Control:  Labor support without medications I/D:  n/a Anticipated MOD:  NSVD  Veronica ShellerHeather Khian Garrett 12/17/2017, 11:32 PM

## 2017-12-17 NOTE — Progress Notes (Signed)
Labor Progress Note Veronica Garrett is a 32 y.o. G2P1001 at 5489w0d presented for IOL for postdates  S:  Patient comfortable  O:  BP 122/63   Pulse 83   Temp 98.2 F (36.8 C) (Oral)   Resp 17   Ht 5\' 6"  (1.676 m)   Wt 173 lb (78.5 kg)   LMP 03/27/2017   BMI 27.92 kg/m   Fetal Tracing:  Baseline: 140 Variability: moderate Accels: 15x15 Decels: variable  Toco: 1-5  CVE: Dilation: 2.5 Effacement (%): 50 Cervical Position: Posterior Station: -3 Presentation: Vertex Exam by:: Veronica ShaveYancey Luft RN    A&P: 32 y.o. G2P1001 4689w0d IOL for postdates  Long discussion with patient regarding last delivery and the shoulder dystocia. Patient denies any deficits of first child. She reports this baby feels bigger. Risks of repeat shoulder dystocia if we continue induction reviewed with patient. Patient desires to continue with induction.  #Labor: Patient requesting pitocin for induction instead of cytotec. Will start pitocin 2x2 #Pain: per patient request #FWB: Cat 1 #GBS positive  Rolm Bookbinderaroline M Arlett Goold, CNM 5:49 PM

## 2017-12-17 NOTE — H&P (Addendum)
OBSTETRIC ADMISSION HISTORY AND PHYSICAL  Veronica Garrett is a 32 y.o. female G2P1001 with IUP at 7039w0d by US presenting for IOL for post dates gestation. She does not currently feel contractions. She reports +FMs, No LOF, no VB, no blurry vision, headaches or peripheral edema, and RUQ pain.  She plans on breast feeding. She request Mirena IUD for birth control. [facility]  Patient speaks [place] and some [place], Nurse, learning disabilitytranslator Amina used in obtaining history and physical.   Dating: By US --->  Estimated Date of Delivery: 12/10/17  Sono:    @[redacted]w[redacted]d , normal anatomy, cephalic presentation, 330.5g, 53% EFW, anterior placenta, AFI wnl    Prenatal History/Complications:  Shoulder dystocia with previous pregnancy which also required IOL for post dates.   Past Medical History: History reviewed. No pertinent past medical history.  Past Surgical History: History reviewed. No pertinent surgical history.  Obstetrical History: OB History    Gravida  2   Para  1   Term  1   Preterm      AB      Living  1     SAB      TAB      Ectopic      Multiple  0   Live Births  1           Social History: Social History   Socioeconomic History  . Marital status: Married    Spouse name: Not on file  . Number of children: Not on file  . Years of education: Not on file  . Highest education level: Not on file  Occupational History  . Not on file  Social Needs  . Financial resource strain: Not on file  . Food insecurity:    Worry: Not on file    Inability: Not on file  . Transportation needs:    Medical: Not on file    Non-medical: Not on file  Tobacco Use  . Smoking status: Never Smoker  . Smokeless tobacco: Never Used  Substance and Sexual Activity  . Alcohol use: No    Alcohol/week: 0.0 oz  . Drug use: No  . Sexual activity: Yes  Lifestyle  . Physical activity:    Days per week: Not on file    Minutes per session: Not on file  . Stress:  Not on file  Relationships  . Social connections:    Talks on phone: Not on file    Gets together: Not on file    Attends religious service: Not on file    Active member of club or organization: Not on file    Attends meetings of clubs or organizations: Not on file    Relationship status: Not on file  Other Topics Concern  . Not on file  Social History Narrative  . Not on file    Family History: Family History  Problem Relation Age of Onset  . Alcohol abuse Neg Hx   . Asthma Neg Hx   . Arthritis Neg Hx   . Birth defects Neg Hx   . Cancer Neg Hx   . COPD Neg Hx   . Depression Neg Hx   . Diabetes Neg Hx   . Drug abuse Neg Hx   . Early death Neg Hx   . Hearing loss Neg Hx   . Heart disease Neg Hx   . Hyperlipidemia Neg Hx   . Hypertension Neg Hx   . Kidney disease Neg Hx   . Learning  disabilities Neg Hx   . Mental illness Neg Hx   . Mental retardation Neg Hx   . Miscarriages / Stillbirths Neg Hx   . Vision loss Neg Hx   . Varicose Veins Neg Hx   . Stroke Neg Hx     Allergies: No Known Allergies  Medications Prior to Admission  Medication Sig Dispense Refill Last Dose  . acetaminophen (TYLENOL) 325 MG tablet Take 2 tablets (650 mg total) by mouth every 4 (four) hours as needed (for pain scale < 4). 20 tablet 0   . Prenatal Multivit-Min-Fe-FA (PRENATAL VITAMINS) 0.8 MG tablet Take 1 tablet by mouth daily. 30 tablet 12      Review of Systems   All systems reviewed and negative except as stated in HPI  Blood pressure 135/77, pulse 85, temperature 98.3 F (36.8 C), temperature source Oral, resp. rate 18, height 5\' 6"  (1.676 m), weight 78.5 kg (173 lb), last menstrual period 03/27/2017, unknown if currently breastfeeding. General appearance: alert, cooperative and no distress Lungs: clear to auscultation bilaterally Heart: regular rate and rhythm Abdomen: soft, non-tender; bowel sounds normal Extremities: Homans sign is negative, no sign of DVT Presentation:  cephalic Fetal monitoringBaseline: 145 bpm, Variability: Good {> 6 bpm), Accelerations: Reactive and Decelerations: Absent Uterine activity: Irregular Dilation: 2 Effacement (%): 50 Station: -3 Exam by:: Katherine g jones Rn    Prenatal labs: ABO, Rh: O/Positive/-- (09/20 0000) Antibody: Negative (09/20 0000) Rubella: Immune (09/20 0000) RPR: Nonreactive (09/20 0000)  HBsAg: Negative (09/20 0000)  HIV: Non-reactive (09/20 0000)  GBS: Positive (02/14 0000)  1 hr Glucola: 97 on 06/17/2017 Genetic screening:  Negative quad Anatomy US @[redacted]w[redacted]d  female with normal anatomy   Prenatal Transfer Tool  Maternal Diabetes: No Genetic Screening: Normal Maternal Ultrasounds/Referrals: Normal Fetal Ultrasounds or other Referrals:  None Maternal Substance Abuse:  No Significant Maternal Medications:  None Significant Maternal Lab Results: Lab values include: Group B Strep positive  Results for orders placed or performed during the hospital encounter of 12/17/17 (from the past 24 hour(s))  CBC   Collection Time: 12/17/17  8:22 AM  Result Value Ref Range   WBC 7.9 4.0 - 10.5 K/uL   RBC 4.42 3.87 - 5.11 MIL/uL   Hemoglobin 12.6 12.0 - 15.0 g/dL   HCT 16.1 09.6 - 04.5 %   MCV 83.5 78.0 - 100.0 fL   MCH 28.5 26.0 - 34.0 pg   MCHC 34.1 30.0 - 36.0 g/dL   RDW 40.9 81.1 - 91.4 %   Platelets 168 150 - 400 K/uL    Patient Active Problem List   Diagnosis Date Noted  . Post-dates pregnancy 12/17/2017  . NSVD (normal spontaneous vaginal delivery) 02/25/2016  . Post term pregnancy 02/22/2016    Assessment/Plan:  Veronica Garrett is a 32 y.o. G2P1001 at [redacted]w[redacted]d here for IOL for post dates.   #Labor: IOL on Cytotec. Place foley bulb in 4 hours if patient is able to tolerate.  #Pain: As per patient request  #FWB:  Category I  #ID:  GBS pos - PCN  #MOF: Breastfeeding #MOC: Mirena IUD #Circ:  As outpatient   Felicie Morn, Medical Student  12/17/2017, 9:24 AM   I confirm that I have  verified the information documented in the medical student's note and that I have also personally reperformed the physical exam and all medical decision making activities.  Rolm Bookbinder, CNM 12/17/17 11:34 AM

## 2017-12-17 NOTE — Anesthesia Pain Management Evaluation Note (Signed)
  CRNA Pain Management Visit Note  Patient: Veronica Garrett, 32 y.o., female  "Hello I am a member of the anesthesia team at [facility]. We have an anesthesia team available at all times to provide care throughout the hospital, including epidural management and anesthesia for C-section. I don't know your plan for the delivery whether it a natural birth, water birth, IV sedation, nitrous supplementation, doula or epidural, but we want to meet your pain goals."   1.Was your pain managed to your expectations on prior hospitalizations?   Yes   2.What is your expectation for pain management during this hospitalization?     Natural  3.How can we help you reach that goal? Nursing interventions.  Record the patient's initial score and the patient's pain goal.   Pain: 5  Pain Goal: 10 [facility] wants you to be able to say your pain was always managed very well.  Oaklen Thiam 12/17/2017

## 2017-12-17 NOTE — Progress Notes (Signed)
Labor Progress Note Veronica Garrett is a 32 y.o. G2P1001 at 6752w0d presented for postdates IOL  S:  Patient comfortable  O:  BP 119/68 (BP Location: Left Arm)   Pulse 68   Temp 98.1 F (36.7 C) (Oral)   Resp 20   Ht 5\' 6"  (1.676 m)   Wt 173 lb (78.5 kg)   LMP 03/27/2017   BMI 27.92 kg/m   Fetal Tracing:  Baseline: 135 Variability: moderate Accels: 15x15 Decels: none  Toco: 5-15  CVE: Dilation: 3 Effacement (%): 50 Cervical Position: Posterior Station: -3 Presentation: Vertex Exam by:: Cleone Slimaroline Neill CNM   A&P: 32 y.o. G2P1001 5552w0d postdates IOL #Labor: Continue cytotec for ripening. Patient unable to tolerate attempted foley bulb placement #Pain: per patient request #FWB: Cat 1 #GBS positive  Rolm Bookbinderaroline M Neill, CNM 1:19 PM

## 2017-12-18 ENCOUNTER — Encounter (HOSPITAL_COMMUNITY): Payer: Self-pay

## 2017-12-18 DIAGNOSIS — Z758 Other problems related to medical facilities and other health care: Secondary | ICD-10-CM | POA: Diagnosis present

## 2017-12-18 DIAGNOSIS — Z789 Other specified health status: Secondary | ICD-10-CM | POA: Diagnosis present

## 2017-12-18 DIAGNOSIS — O99824 Streptococcus B carrier state complicating childbirth: Secondary | ICD-10-CM

## 2017-12-18 DIAGNOSIS — Z3A41 41 weeks gestation of pregnancy: Secondary | ICD-10-CM

## 2017-12-18 DIAGNOSIS — O480 Post-term pregnancy: Secondary | ICD-10-CM

## 2017-12-18 DIAGNOSIS — Z8759 Personal history of other complications of pregnancy, childbirth and the puerperium: Secondary | ICD-10-CM

## 2017-12-18 LAB — RPR: RPR Ser Ql: NONREACTIVE

## 2017-12-18 MED ORDER — BENZOCAINE-MENTHOL 20-0.5 % EX AERO
1.0000 "application " | INHALATION_SPRAY | CUTANEOUS | Status: DC | PRN
  Administered 2017-12-18: 1 via TOPICAL
  Filled 2017-12-18: qty 56

## 2017-12-18 MED ORDER — SENNOSIDES-DOCUSATE SODIUM 8.6-50 MG PO TABS
2.0000 | ORAL_TABLET | Freq: Every evening | ORAL | Status: DC | PRN
  Administered 2017-12-19: 2 via ORAL
  Filled 2017-12-18: qty 2

## 2017-12-18 MED ORDER — WITCH HAZEL-GLYCERIN EX PADS: 1.0000 "application " | MEDICATED_PAD | CUTANEOUS | Status: DC | PRN

## 2017-12-18 MED ORDER — TETANUS-DIPHTH-ACELL PERTUSSIS 5-2.5-18.5 LF-MCG/0.5 IM SUSP: 0.5000 mL | Freq: Once | INTRAMUSCULAR | Status: DC

## 2017-12-18 MED ORDER — DIPHENHYDRAMINE HCL 25 MG PO CAPS: 25.0000 mg | ORAL_CAPSULE | Freq: Four times a day (QID) | ORAL | Status: DC | PRN

## 2017-12-18 MED ORDER — DIBUCAINE 1 % RE OINT: 1.0000 "application " | TOPICAL_OINTMENT | RECTAL | Status: DC | PRN

## 2017-12-18 MED ORDER — OXYTOCIN 40 UNITS IN LACTATED RINGERS INFUSION - SIMPLE MED: 2.5000 [IU]/h | INTRAVENOUS | Status: DC | PRN

## 2017-12-18 MED ORDER — PRENATAL MULTIVITAMIN CH
1.0000 | ORAL_TABLET | Freq: Every day | ORAL | Status: DC
  Administered 2017-12-18: 1 via ORAL
  Filled 2017-12-18 (×2): qty 1

## 2017-12-18 MED ORDER — COCONUT OIL OIL: 1.0000 "application " | TOPICAL_OIL | Status: DC | PRN

## 2017-12-18 MED ORDER — OXYCODONE HCL 5 MG PO TABS: 5.0000 mg | ORAL_TABLET | Freq: Four times a day (QID) | ORAL | Status: DC | PRN

## 2017-12-18 MED ORDER — ACETAMINOPHEN 325 MG PO TABS: 650.0000 mg | ORAL_TABLET | ORAL | Status: DC | PRN

## 2017-12-18 MED ORDER — ONDANSETRON HCL 4 MG/2ML IJ SOLN: 4.0000 mg | INTRAMUSCULAR | Status: DC | PRN

## 2017-12-18 MED ORDER — SIMETHICONE 80 MG PO CHEW: 80.0000 mg | CHEWABLE_TABLET | ORAL | Status: DC | PRN

## 2017-12-18 MED ORDER — DOCUSATE SODIUM 100 MG PO CAPS: 100.0000 mg | ORAL_CAPSULE | Freq: Two times a day (BID) | ORAL | Status: DC | PRN

## 2017-12-18 MED ORDER — ONDANSETRON HCL 4 MG PO TABS: 4.0000 mg | ORAL_TABLET | ORAL | Status: DC | PRN

## 2017-12-18 MED ORDER — IBUPROFEN 600 MG PO TABS
600.0000 mg | ORAL_TABLET | Freq: Four times a day (QID) | ORAL | Status: DC
  Administered 2017-12-18 – 2017-12-20 (×10): 600 mg via ORAL
  Filled 2017-12-18 (×10): qty 1

## 2017-12-18 NOTE — Plan of Care (Signed)
Mother progressing appropriately. 

## 2017-12-18 NOTE — Progress Notes (Signed)
error 

## 2017-12-18 NOTE — Progress Notes (Signed)
Interpreter Cristy FriedlanderFlorence [phone] used for admit education and information.

## 2017-12-19 NOTE — Lactation Note (Signed)
This note was copied from a baby's chart. Lactation Consultation Note  Patient Name: Boy Tsion Apperson ZOXWR'UToday's Date: 12/19/2017 Reason for consult: Initial assessmLysle Dingwallent   [employer] Interpreter used [phone] but during consult mother was answering in [place] and mother states she did not need interpretation. P2, Baby 36 hours old.  Ex BF 1.5 years. Reviewed hand expression with drops easily expressed. Baby sleeping. Mother states baby recently breastfed for 15 min. Mother denies problems or concerns. Mom encouraged to feed baby 8-12 times/24 hours and with feeding cues.  Mom made aware of O/P services, breastfeeding support groups, community resources, and our phone # for post-discharge questions.     Maternal Data Has patient been taught Hand Expression?: Yes Does the patient have breastfeeding experience prior to this delivery?: Yes  Feeding Feeding Type: Breast Fed Length of feed: 15 min  LATCH Score Latch: Grasps breast easily, tongue down, lips flanged, rhythmical sucking.  Audible Swallowing: A few with stimulation  Type of Nipple: Everted at rest and after stimulation  Comfort (Breast/Nipple): Soft / non-tender  Hold (Positioning): No assistance needed to correctly position infant at breast.  LATCH Score: 9  Interventions    Lactation Tools Discussed/Used     Consult Status Consult Status: Follow-up Date: 12/20/17 Follow-up type: In-patient    Dahlia ByesBerkelhammer, Ruth [facility] 12/19/2017, 2:16 PM

## 2017-12-19 NOTE — Progress Notes (Signed)
Mom understands english and states she understands what I have said and taught her today.

## 2017-12-19 NOTE — Progress Notes (Signed)
Post Partum Day 1 Subjective: no complaints, up ad lib, voiding, tolerating PO and + flatus  Objective: Blood pressure 116/66, pulse 72, temperature 99.1 F (37.3 C), temperature source Oral, resp. rate 16, height 5\' 6"  (1.676 m), weight 173 lb (78.5 kg), last menstrual period 03/27/2017, SpO2 100 %, unknown if currently breastfeeding.  Physical Exam:  General: alert, cooperative, appears stated age and no distress Lochia: appropriate Uterine Fundus: firm Incision: n/a DVT Evaluation: No evidence of DVT seen on physical exam.  Recent Labs    12/17/17 0822  HGB 12.6  HCT 36.9    Assessment/Plan: Plan for discharge tomorrow   LOS: 2 days   Veronica Garrett 12/19/2017, 8:56 AM

## 2017-12-20 MED ORDER — IBUPROFEN 600 MG PO TABS: 600.0000 mg | ORAL_TABLET | Freq: Four times a day (QID) | ORAL | 0 refills | Status: DC

## 2017-12-20 NOTE — Discharge Instructions (Signed)
Vaginal Delivery, Care After °Refer to this sheet in the next few weeks. These instructions provide you with information about caring for yourself after vaginal delivery. Your health care provider may also give you more specific instructions. Your treatment has been planned according to current medical practices, but problems sometimes occur. Call your health care provider if you have any problems or questions. °What can I expect after the procedure? °After vaginal delivery, it is common to have: °· Some bleeding from your vagina. °· Soreness in your abdomen, your vagina, and the area of skin between your vaginal opening and your anus (perineum). °· Pelvic cramps. °· Fatigue. ° °Follow these instructions at home: °Medicines °· Take over-the-counter and prescription medicines only as told by your health care provider. °· If you were prescribed an antibiotic medicine, take it as told by your health care provider. Do not stop taking the antibiotic until it is finished. °Driving ° °· Do not drive or operate heavy machinery while taking prescription pain medicine. °· Do not drive for 24 hours if you received a sedative. °Lifestyle °· Do not drink alcohol. This is especially important if you are breastfeeding or taking medicine to relieve pain. °· Do not use tobacco products, including cigarettes, chewing tobacco, or e-cigarettes. If you need help quitting, ask your health care provider. °Eating and drinking °· Drink at least 8 eight-ounce glasses of water every day unless you are told not to by your health care provider. If you choose to breastfeed your baby, you may need to drink more water than this. °· Eat high-fiber foods every day. These foods may help prevent or relieve constipation. High-fiber foods include: °? Whole grain cereals and breads. °? Brown rice. °? Beans. °? Fresh fruits and vegetables. °Activity °· Return to your normal activities as told by your health care provider. Ask your health care provider  what activities are safe for you. °· Rest as much as possible. Try to rest or take a nap when your baby is sleeping. °· Do not lift anything that is heavier than your baby or 10 lb (4.5 kg) until your health care provider says that it is safe. °· Talk with your health care provider about when you can engage in sexual activity. This may depend on your: °? Risk of infection. °? Rate of healing. °? Comfort and desire to engage in sexual activity. °Vaginal Care °· If you have an episiotomy or a vaginal tear, check the area every day for signs of infection. Check for: °? More redness, swelling, or pain. °? More fluid or blood. °? Warmth. °? Pus or a bad smell. °· Do not use tampons or douches until your health care provider says this is safe. °· Watch for any blood clots that may pass from your vagina. These may look like clumps of dark red, brown, or black discharge. °General instructions °· Keep your perineum clean and dry as told by your health care provider. °· Wear loose, comfortable clothing. °· Wipe from front to back when you use the toilet. °· Ask your health care provider if you can shower or take a bath. If you had an episiotomy or a perineal tear during labor and delivery, your health care provider may tell you not to take baths for a certain length of time. °· Wear a bra that supports your breasts and fits you well. °· If possible, have someone help you with household activities and help care for your baby for at least a few days after   you leave the hospital. °· Keep all follow-up visits for you and your baby as told by your health care provider. This is important. °Contact a health care provider if: °· You have: °? Vaginal discharge that has a bad smell. °? Difficulty urinating. °? Pain when urinating. °? A sudden increase or decrease in the frequency of your bowel movements. °? More redness, swelling, or pain around your episiotomy or vaginal tear. °? More fluid or blood coming from your episiotomy or  vaginal tear. °? Pus or a bad smell coming from your episiotomy or vaginal tear. °? A fever. °? A rash. °? Little or no interest in activities you used to enjoy. °? Questions about caring for yourself or your baby. °· Your episiotomy or vaginal tear feels warm to the touch. °· Your episiotomy or vaginal tear is separating or does not appear to be healing. °· Your breasts are painful, hard, or turn red. °· You feel unusually sad or worried. °· You feel nauseous or you vomit. °· You pass large blood clots from your vagina. If you pass a blood clot from your vagina, save it to show to your health care provider. Do not flush blood clots down the toilet without having your health care provider look at them. °· You urinate more than usual. °· You are dizzy or light-headed. °· You have not breastfed at all and you have not had a menstrual period for 12 weeks after delivery. °· You have stopped breastfeeding and you have not had a menstrual period for 12 weeks after you stopped breastfeeding. °Get help right away if: °· You have: °? Pain that does not go away or does not get better with medicine. °? Chest pain. °? Difficulty breathing. °? Blurred vision or spots in your vision. °? Thoughts about hurting yourself or your baby. °· You develop pain in your abdomen or in one of your legs. °· You develop a severe headache. °· You faint. °· You bleed from your vagina so much that you fill two sanitary pads in one hour. °This information is not intended to replace advice given to you by your health care provider. Make sure you discuss any questions you have with your health care provider. °Document Released: 09/11/2000 Document Revised: 02/26/2016 Document Reviewed: 09/29/2015 °Elsevier Interactive Patient Education © 2018 [employer] ° °

## 2017-12-20 NOTE — Discharge Summary (Addendum)
OB Discharge Summary    Patient Name: Veronica Garrett DOB: Dec 06, 1985 MRN: [id number]  Date of admission: 12/17/2017 Delivering MD: Frederik PearEGELE, JULIE P   Date of discharge: 12/20/2017  Admitting diagnosis: INDUCTION Intrauterine pregnancy: 5067w1d     Secondary diagnosis:  Active Problems:   Post-dates pregnancy   History of shoulder dystocia in prior pregnancy   Language barrier  Additional problems: None     Discharge diagnosis: Term Pregnancy Delivered                                                                                                Post partum procedures: None  Augmentation: AROM, Pitocin and Cytotec  Complications: None  Hospital course:  Induction of Labor With Vaginal Delivery   32 y.o. yo [id number] at 6667w1d was admitted to the hospital 12/17/2017 for induction of labor.  Indication for induction: Postdates.  Patient had an uncomplicated labor course as follows: Membrane Rupture Time/Date: 11:25 PM ,12/17/2017   Intrapartum Procedures: Episiotomy: None [1]                                         Lacerations:  Periurethral [8]  Patient had delivery of a Viable infant.  Information for the patient's newborn:  Alphonsa OverallBoutani, Boy Willer [[id number]][[id number]]  Delivery Method: Vag-Spont   12/18/2017  Details of delivery can be found in separate delivery note.  Patient had a routine postpartum course. Patient is discharged home 12/20/17.  Physical exam  Vitals:   12/18/17 1615 12/18/17 1843 12/19/17 0532 12/19/17 1900  BP: 119/68 121/84 116/66 (!) 124/94  Pulse: 71 79 72 81  Resp: 18 18 16 19   Temp: 98.3 F (36.8 C) 98.5 F (36.9 C) 99.1 F (37.3 C) 99.6 F (37.6 C)  TempSrc: Oral Axillary Oral Oral  SpO2: 100%     Weight:      Height:       General: alert, cooperative and no distress Lochia: appropriate Uterine Fundus: firm Incision: N/A DVT Evaluation: No evidence of DVT seen on physical exam. Labs: Lab Results  Component Value Date   WBC 7.9 12/17/2017    HGB 12.6 12/17/2017   HCT 36.9 12/17/2017   MCV 83.5 12/17/2017   PLT 168 12/17/2017   CMP Latest Ref Rng & Units 02/22/2016  Glucose 65 - 99 mg/dL 81  BUN 6 - 20 mg/dL 6  Creatinine 6.960.44 - 2.951.00 mg/dL 2.840.51  Sodium 132135 - 440145 mmol/L 136  Potassium 3.5 - 5.1 mmol/L 3.8  Chloride 101 - 111 mmol/L 104  CO2 22 - 32 mmol/L 23  Calcium 8.9 - 10.3 mg/dL 9.7  Total Protein 6.5 - 8.1 g/dL 7.0  Total Bilirubin 0.3 - 1.2 mg/dL 0.9  Alkaline Phos 38 - 126 U/L 177(H)  AST 15 - 41 U/L 34  ALT 14 - 54 U/L 23    Discharge instruction: per After Visit Summary and "Baby and Me Booklet".  After visit meds:  Allergies as of 12/20/2017   No Known Allergies  Medication List    TAKE these medications   ibuprofen 600 MG tablet Commonly known as:  ADVIL,MOTRIN Take 1 tablet (600 mg total) by mouth every 6 (six) hours.   Prenatal Vitamins 0.8 MG tablet Take 1 tablet by mouth daily.       Diet: routine diet  Activity: Advance as tolerated. Pelvic rest for 6 weeks.   Outpatient follow up: 4 weeks Follow up Appt:No future appointments. Follow up Visit: Follow-up Information    Department, [facility]. Call.   Why:  for postpartum follow up in 4 weeks  Contact information: [address] [place] [address] [phone]           Postpartum contraception: IUD Mirena  Newborn Data: Live born female  Birth Weight: 6 lb 14.2 oz (3124 g) APGAR: 8, 9  Newborn Delivery   Birth date/time:  12/18/2017 00:20:00 Delivery type:  Vaginal, Spontaneous     Baby Feeding: Breast Disposition:home with mother  12/20/2017 Ellwood Dense, DO  OB FELLOW DISCHARGE ATTESTATION  I have seen and examined this patient. I agree with above documentation and have made edits as needed.   Caryl Ada, DO OB Fellow 11:50 AM

## 2017-12-20 NOTE — Lactation Note (Signed)
This note was copied from a baby's chart. Lactation Consultation Note  Patient Name: Veronica Lysle DingwallBakoubamana Pardoe ZOXWR'UToday's Date: 12/20/2017 Reason for consult: Follow-up assessment   P2, Baby 55 hours old and sleeping.  Stools transitioned to green. Mother's breasts are filling.  Demonstrated how to use manual pump. Mom encouraged to feed baby 8-12 times/24 hours and with feeding cues.  Reviewed engorgement care and monitoring voids/stools. Denies questions or concerns.   Maternal Data    Feeding Feeding Type: Breast Fed Length of feed: 10 min  LATCH Score                   Interventions    Lactation Tools Discussed/Used     Consult Status Consult Status: Complete    Hardie PulleyBerkelhammer, Ruth Boschen 12/20/2017, 8:15 AM

## 2019-04-13 IMAGING — US US OB COMP LESS 14 WK
1 series · 15 of 18 positions shown · non-contrast
Comparison: None.

CLINICAL DATA: First trimester vaginal bleeding. Gestational age by
LMP of 9 weeks 3 days.

EXAM:
OBSTETRIC <14 WK ULTRASOUND
TECHNIQUE: Transabdominal ultrasound was performed for evaluation of the
gestation as well as the maternal uterus and adnexal regions.

[Series 1: us ob comp less 14 wk · 18 acquisitions, 15 frames shown]
[im 1/18]
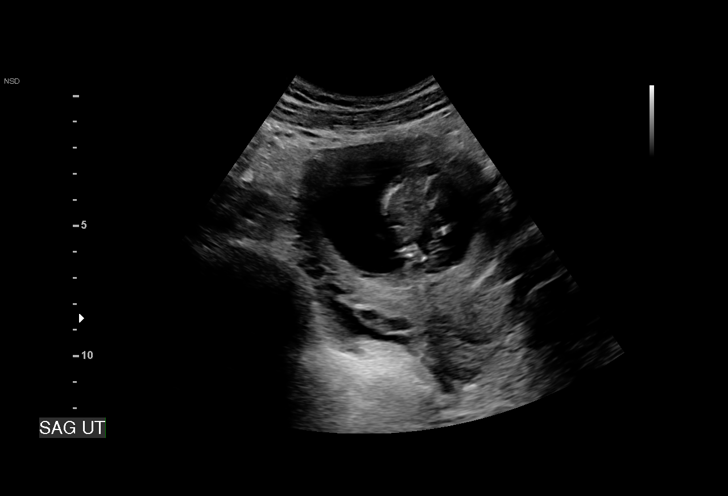
[im 2/18]
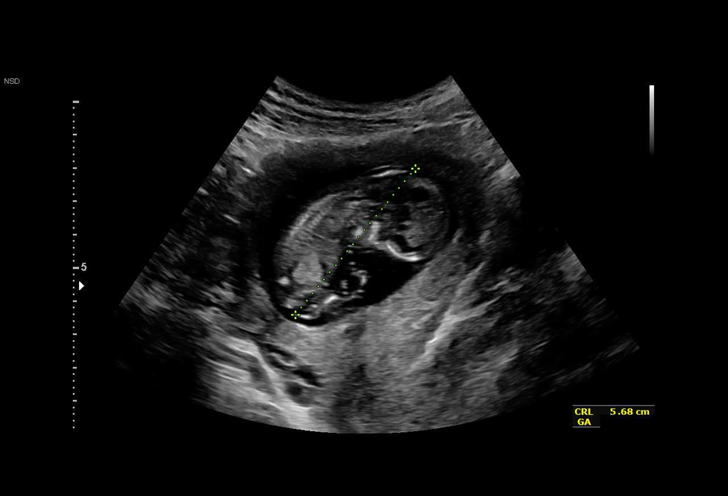
[im 4/18]
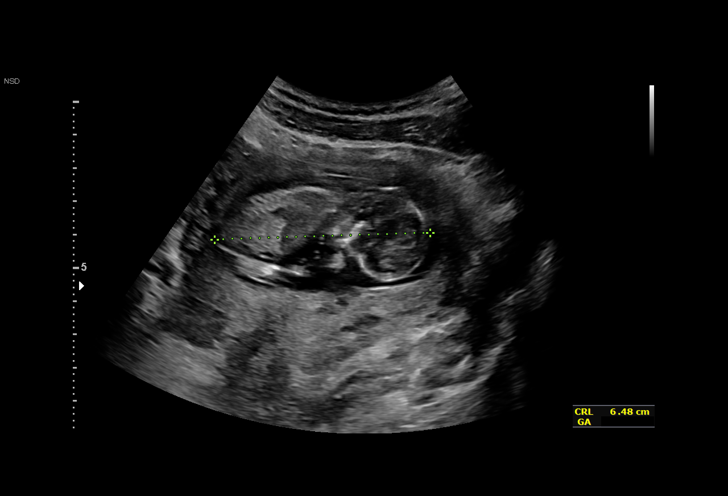
[im 5/18]
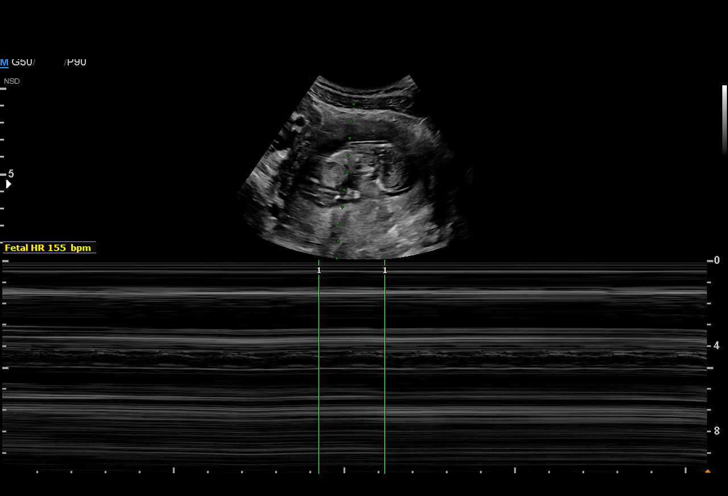
[im 6/18]
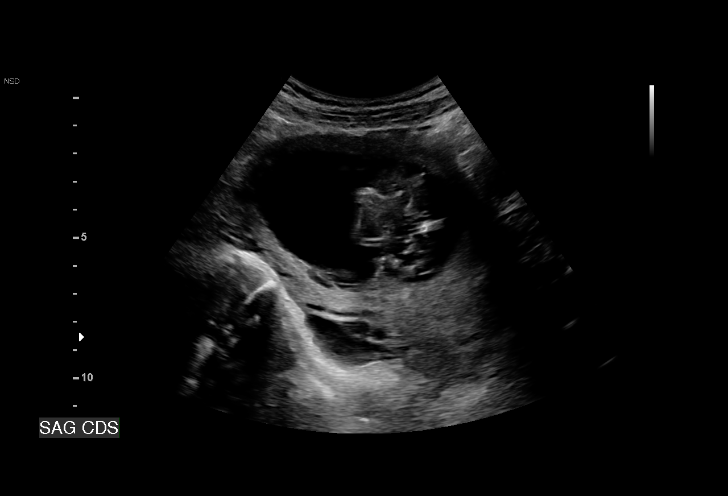
[im 7/18]
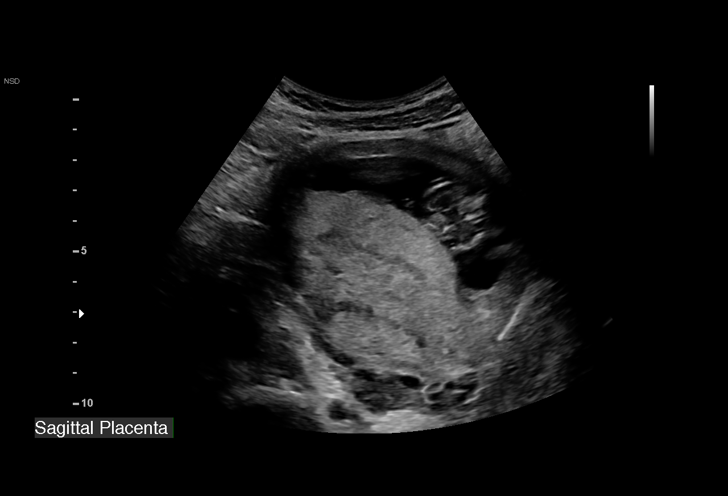
[im 8/18]
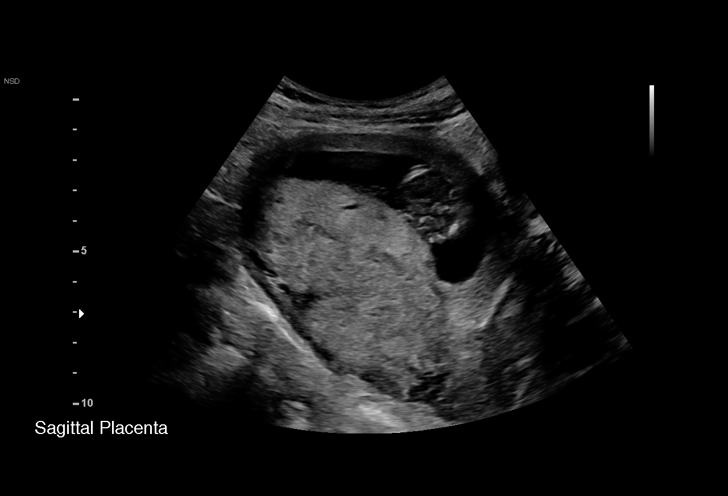
[im 10/18]
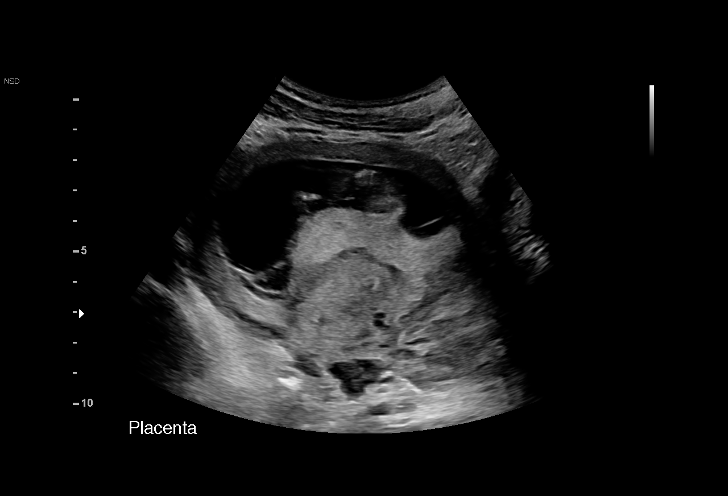
[im 11/18]
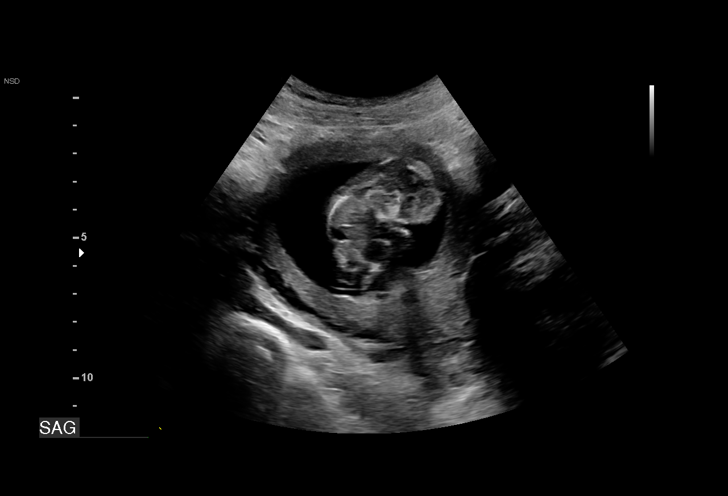
[im 12/18]
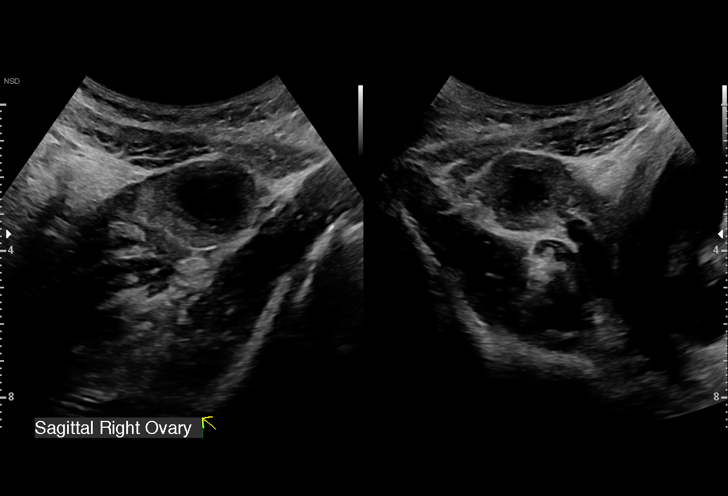
[im 13/18]
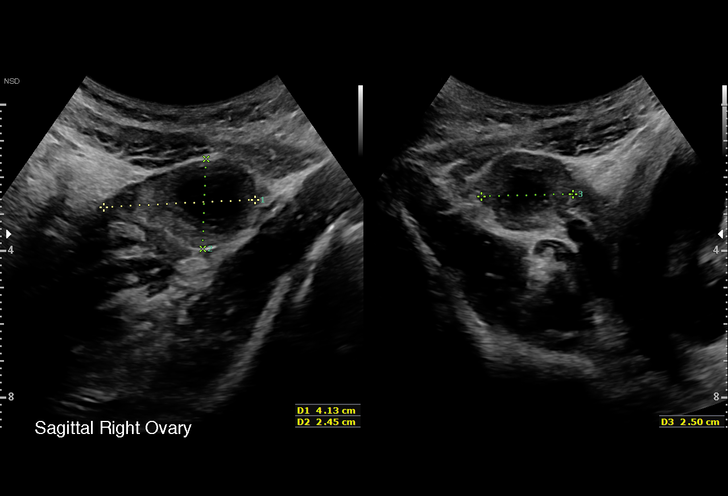
[im 14/18]
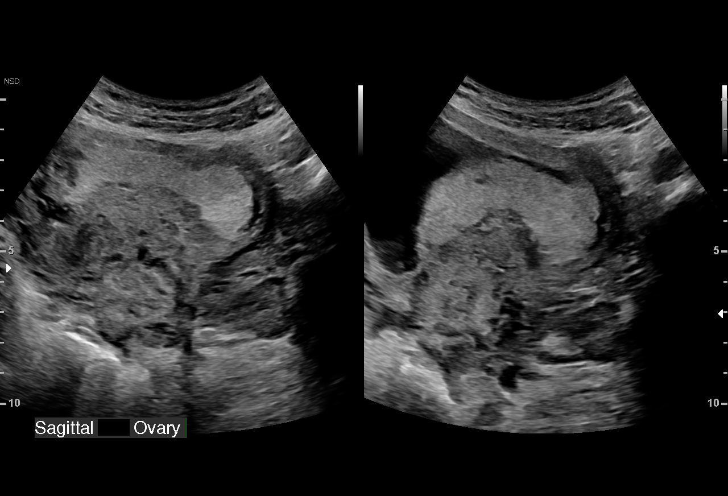
[im 16/18]
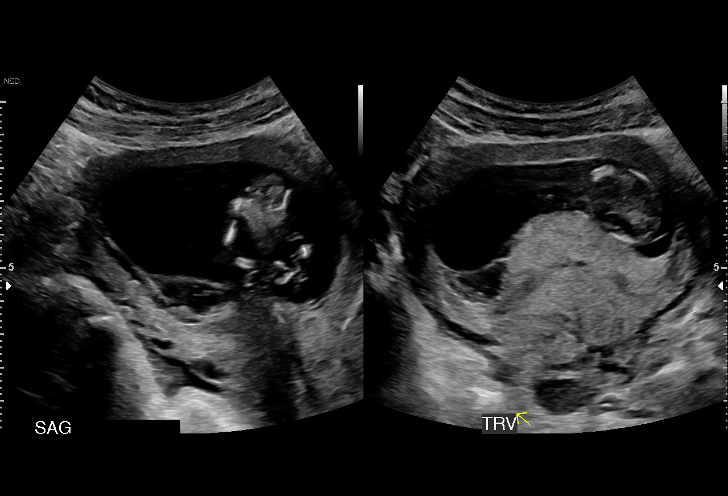
[im 17/18]
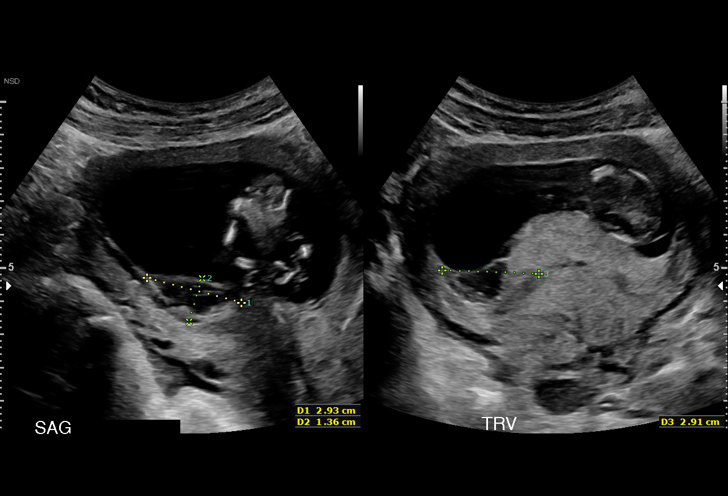
[im 18/18]
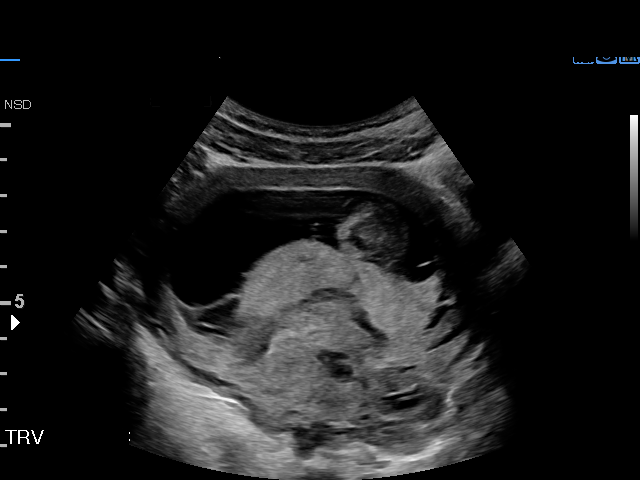

[15 of 18 positions shown; findings below may reference images not displayed]

FINDINGS: Intrauterine gestational sac: Single

Yolk sac:  Not Visualized.

Embryo:  Visualized.

Cardiac Activity: Visualized.

Heart Rate: 155 bpm

CRL:   61  mm   12 w 4 d                  US EDC: 12/10/2017

Subchorionic hemorrhage:  None visualized.

Maternal uterus/adnexae: Small right ovarian corpus luteum noted.
Otherwise normal appearance of both ovaries. No mass or abnormal
free fluid identified.
IMPRESSION: Single living IUP measuring 12 weeks 4 days, with US EDC of
12/10/2017.

## 2021-05-14 NOTE — Progress Notes (Signed)
Subjective:    Veronica Garrett - 35 y.o. female MRN [id number]  Date of birth: 1985-11-05  HPI  Veronica Garrett is to establish care.  Current issues and/or concerns: Reports yellow vaginal discharge and intermittent left lower stomach pain which began yesterday.   ROS per HPI    Health Maintenance: Health Maintenance Due  Topic Date Due   Hepatitis C Screening  Never done   TETANUS/TDAP  Never done   PAP SMEAR-Modifier  Never done   INFLUENZA VACCINE  04/28/2021     Past Medical History: Patient Active Problem List   Diagnosis Date Noted   History of shoulder dystocia in prior pregnancy 12/18/2017   Language barrier 12/18/2017   Post-dates pregnancy 12/17/2017   NSVD (normal spontaneous vaginal delivery) 02/25/2016   Post term pregnancy 02/22/2016      Social History   reports that she has never smoked. She has never used smokeless tobacco. She reports that she does not drink alcohol and does not use drugs.   Family History  family history is not on file.   Medications: reviewed and updated   Objective:   Physical Exam BP 130/85 (BP Location: Left Arm, Patient Position: Sitting, Cuff Size: Normal)   Pulse 67   Temp 98.3 F (36.8 C)   Resp 18   Ht 5' 5.55" (1.665 m)   Wt 179 lb 6.4 oz (81.4 kg)   SpO2 98%   BMI 29.35 kg/m   Physical Exam HENT:     Head: Normocephalic and atraumatic.  Eyes:     Extraocular Movements: Extraocular movements intact.     Conjunctiva/sclera: Conjunctivae normal.     Pupils: Pupils are equal, round, and reactive to light.  Cardiovascular:     Rate and Rhythm: Normal rate and regular rhythm.     Pulses: Normal pulses.     Heart sounds: Normal heart sounds.  Pulmonary:     Effort: Pulmonary effort is normal.     Breath sounds: Normal breath sounds.  Abdominal:     General: Bowel sounds are normal.     Palpations: Abdomen is soft.  Musculoskeletal:     Cervical back: Normal range of motion and neck supple.   Neurological:     General: No focal deficit present.     Mental Status: She is alert and oriented to person, place, and time.  Psychiatric:        Mood and Affect: Mood normal.        Behavior: Behavior normal.       Assessment & Plan:  1. Encounter to establish care: - Patient presents today to establish care.  - Return for annual physical examination, labs, and health maintenance. Arrive fasting meaning having no food for at least 8 hours prior to appointment. You may have only water or black coffee. Please take scheduled medications as normal.  2. Vaginal discharge: - Urinalysis to screen for possible urinary tract infection.  - Cervicovaginal self-swab to screen for chlamydia, gonorrhea, trichomonas, bacterial vaginitis, and candida vaginitis. - Follow-up with primary provider as scheduled.  - POCT URINALYSIS DIP (CLINITEK) - Cervicovaginal ancillary only  3. Language barrier: - Patient accompanied by [facility] from [employer] who serves as interpreter.     Patient was given clear instructions to go to Emergency Department or return to medical center if symptoms don't improve, worsen, or new problems develop.The patient verbalized understanding.  I discussed the assessment and treatment plan with the patient. The patient was provided an opportunity to ask  questions and all were answered. The patient agreed with the plan and demonstrated an understanding of the instructions.   The patient was advised to call back or seek an in-person evaluation if the symptoms worsen or if the condition fails to improve as anticipated.    Ricky Stabs, NP 05/16/2021, 10:29 AM Primary Care at [facility]

## 2021-05-16 ENCOUNTER — Other Ambulatory Visit: Payer: Self-pay

## 2021-05-16 ENCOUNTER — Other Ambulatory Visit: Payer: Self-pay | Admitting: Family

## 2021-05-16 ENCOUNTER — Ambulatory Visit (INDEPENDENT_AMBULATORY_CARE_PROVIDER_SITE_OTHER): Payer: 59 | Admitting: Family

## 2021-05-16 ENCOUNTER — Other Ambulatory Visit (HOSPITAL_COMMUNITY)
Admission: RE | Admit: 2021-05-16 | Discharge: 2021-05-16 | Disposition: A | Discharge status: Home / Self Care | Payer: 59 | Source: Ambulatory Visit | Attending: Family | Admitting: Family

## 2021-05-16 ENCOUNTER — Encounter: Payer: Self-pay | Admitting: Family

## 2021-05-16 VITALS — BP 130/85 | HR 67 | Temp 98.3°F | Resp 18 | Ht 65.55 in | Wt 179.4 lb

## 2021-05-16 DIAGNOSIS — Z7689 Persons encountering health services in other specified circumstances: Secondary | ICD-10-CM

## 2021-05-16 DIAGNOSIS — Z789 Other specified health status: Secondary | ICD-10-CM

## 2021-05-16 DIAGNOSIS — N898 Other specified noninflammatory disorders of vagina: Secondary | ICD-10-CM | POA: Diagnosis not present

## 2021-05-16 LAB — POCT URINALYSIS DIP (CLINITEK)
Bilirubin, UA: NEGATIVE
Glucose, UA: NEGATIVE mg/dL
Ketones, POC UA: NEGATIVE mg/dL
Leukocytes, UA: NEGATIVE
Nitrite, UA: NEGATIVE
POC PROTEIN,UA: NEGATIVE
Spec Grav, UA: 1.02 (ref 1.010–1.025)
Urobilinogen, UA: 0.2 E.U./dL
pH, UA: 8 (ref 5.0–8.0)

## 2021-05-16 NOTE — Progress Notes (Signed)
Pt presents to establish care pt reports have not been to provider in awhile

## 2021-05-16 NOTE — Patient Instructions (Signed)
Thank you for choosing Primary Care at [facility] for your medical home!    Veronica Garrett was seen by Rema Fendt, NP today.   Ermalinda Memos Asch's primary care provider is Rema Fendt, NP.   For the best care possible,  you should try to see Ricky Stabs, NP whenever you come to clinic.   We look forward to seeing you again soon!  If you have any questions about your visit today,  please call us at [phone]  Or feel free to reach your provider via MyChart.    Keeping you healthy   Get these tests Blood pressure- Have your blood pressure checked once a year by your healthcare provider.  Normal blood pressure is 120/80. Weight- Have your body mass index (BMI) calculated to screen for obesity.  BMI is a measure of body fat based on height and weight. You can also calculate your own BMI at https://www.west-esparza.com/. Cholesterol- Have your cholesterol checked regularly starting at age 36, sooner may be necessary if you have diabetes, high blood pressure, if a family member developed heart diseases at an early age or if you smoke.  Chlamydia, HIV, and other sexual transmitted disease- Get screened each year until the age of 67 then within three months of each new sexual partner. Diabetes- Have your blood sugar checked regularly if you have high blood pressure, high cholesterol, a family history of diabetes or if you are overweight.   Get these vaccines Flu shot- Every fall. Tetanus shot- Every 10 years. Menactra- Single dose; prevents meningitis.   Take these steps Don't smoke- If you do smoke, ask your healthcare provider about quitting. For tips on how to quit, go to www.smokefree.gov or call 1-800-QUIT-NOW. Be physically active- Exercise 5 days a week for at least 30 minutes.  If you are not already physically active start slow and gradually work up to 30 minutes of moderate physical activity.  Examples of moderate activity include walking briskly, mowing the yard,  dancing, swimming bicycling, etc. Eat a healthy diet- Eat a variety of healthy foods such as fruits, vegetables, low fat milk, low fat cheese, yogurt, lean meats, poultry, fish, beans, tofu, etc.  For more information on healthy eating, go to www.thenutritionsource.org Drink alcohol in moderation- Limit alcohol intake two drinks or less a day.  Never drink and drive. Dentist- Brush and floss teeth twice daily; visit your dentis twice a year. Depression-Your emotional health is as important as your physical health.  If you're feeling down, losing interest in things you normally enjoy please talk with your healthcare provider. Gun Safety- If you keep a gun in your home, keep it unloaded and with the safety lock on.  Bullets should be stored separately. Helmet use- Always wear a helmet when riding a motorcycle, bicycle, rollerblading or skateboarding. Safe sex- If you may be exposed to a sexually transmitted infection, use a condom Seat belts- Seat bels can save your life; always wear one. Smoke/Carbon Monoxide detectors- These detectors need to be installed on the appropriate level of your home.  Replace batteries at least once a year. Skin Cancer- When out in the sun, cover up and use sunscreen SPF 15 or higher. Violence- If anyone is threatening or hurting you, please tell your healthcare provider.

## 2021-05-16 NOTE — Addendum Note (Signed)
Addended by: Margorie John on: 05/16/2021 05:01 PM   Modules accepted: Orders

## 2021-05-19 LAB — CERVICOVAGINAL ANCILLARY ONLY
Bacterial Vaginitis (gardnerella): NEGATIVE
Candida Glabrata: NEGATIVE
Candida Vaginitis: NEGATIVE
Chlamydia: NEGATIVE
Comment: NEGATIVE
Comment: NEGATIVE
Comment: NEGATIVE
Comment: NEGATIVE
Comment: NEGATIVE
Comment: NORMAL
Neisseria Gonorrhea: NEGATIVE
Trichomonas: NEGATIVE

## 2021-05-20 NOTE — Progress Notes (Signed)
No urinary tract infection.   Chlamydia, gonorrhea, trichomonas, bacterial vaginitis, and candida vaginitis (sometimes called yeast infection) negative.

## 2021-06-29 NOTE — Progress Notes (Signed)
Patient ID: Veronica Garrett, female    DOB: 1986/06/01  MRN: [id number]  CC: Annual Physical Exam   Subjective: Veronica Garrett is a 35 y.o. female who presents for annual physical exam.   Her concerns today include:  Reports IUD in place for 5 years. Endorses irregular menses and intermittent spotting in between since placement. Denies abdominal pain or vaginal discomfort. Would like to know what may be causing this. Will wait for PAP smear at appointment with Gynecology.   Patient Active Problem List   Diagnosis Date Noted   History of shoulder dystocia in prior pregnancy 12/18/2017   Language barrier 12/18/2017   Post-dates pregnancy 12/17/2017   NSVD (normal spontaneous vaginal delivery) 02/25/2016   Post term pregnancy 02/22/2016     No current outpatient medications on file prior to visit.   No current facility-administered medications on file prior to visit.    No Known Allergies  Social History   Socioeconomic History   Marital status: Married    Spouse name: Not on file   Number of children: Not on file   Years of education: Not on file   Highest education level: Not on file  Occupational History   Not on file  Tobacco Use   Smoking status: Never   Smokeless tobacco: Never  Vaping Use   Vaping Use: Never used  Substance and Sexual Activity   Alcohol use: No    Alcohol/week: 0.0 standard drinks   Drug use: No   Sexual activity: Yes  Other Topics Concern   Not on file  Social History Narrative   Not on file   Social Determinants of Health   Financial Resource Strain: Not on file  Food Insecurity: Not on file  Transportation Needs: Not on file  Physical Activity: Not on file  Stress: Not on file  Social Connections: Not on file  Intimate Partner Violence: Not on file    Family History  Problem Relation Age of Onset   Alcohol abuse Neg Hx    Asthma Neg Hx    Arthritis Neg Hx    Birth defects Neg Hx    Cancer Neg Hx    COPD Neg Hx     Depression Neg Hx    Diabetes Neg Hx    Drug abuse Neg Hx    Early death Neg Hx    Hearing loss Neg Hx    Heart disease Neg Hx    Hyperlipidemia Neg Hx    Hypertension Neg Hx    Kidney disease Neg Hx    Learning disabilities Neg Hx    Mental illness Neg Hx    Mental retardation Neg Hx    Miscarriages / Stillbirths Neg Hx    Vision loss Neg Hx    Varicose Veins Neg Hx    Stroke Neg Hx     No past surgical history on file.  ROS: Review of Systems Negative except as stated above  PHYSICAL EXAM: BP 138/80 (BP Location: Left Arm, Patient Position: Sitting, Cuff Size: Normal)   Pulse 72   Temp 98.5 F (36.9 C) (Oral)   Ht _0  (1.676 m)   Wt 177 lb 12.8 oz (80.6 kg)   LMP 06/24/2021 (Approximate)   SpO2 97%   Breastfeeding No   BMI 28.70 kg/m   Physical Exam HENT:     Head: Normocephalic and atraumatic.     Right Ear: Tympanic membrane, ear canal and external ear normal.     Left Ear: Tympanic  membrane, ear canal and external ear normal.  Eyes:     Extraocular Movements: Extraocular movements intact.     Conjunctiva/sclera: Conjunctivae normal.     Pupils: Pupils are equal, round, and reactive to light.  Cardiovascular:     Rate and Rhythm: Normal rate and regular rhythm.     Pulses: Normal pulses.     Heart sounds: Normal heart sounds.  Pulmonary:     Effort: Pulmonary effort is normal.     Breath sounds: Normal breath sounds.  Chest:     Comments: Patient declined exam.  Abdominal:     General: Bowel sounds are normal.     Palpations: Abdomen is soft.  Genitourinary:    Comments: Patient declined exam.  Musculoskeletal:        General: Normal range of motion.     Cervical back: Normal range of motion and neck supple.  Skin:    General: Skin is warm and dry.     Capillary Refill: Capillary refill takes less than 2 seconds.  Neurological:     General: No focal deficit present.     Mental Status: She is alert and oriented to person, place, and time.   Psychiatric:        Mood and Affect: Mood normal.        Behavior: Behavior normal.    ASSESSMENT AND PLAN: 1. Annual physical exam: - Counseled on 150 minutes of exercise per week as tolerated, healthy eating (including decreased daily intake of saturated fats, cholesterol, added sugars, sodium), STI prevention, and routine healthcare maintenance.  2. Screening for metabolic disorder: - LFY10+FBPZ to check kidney function, liver function, and electrolyte balance.  - CMP14+EGFR  3. Screening for deficiency anemia: - CBC to screen for anemia. - CBC  4. Diabetes mellitus screening: - Hemoglobin A1c to screen for pre-diabetes/diabetes. - Hemoglobin A1c  5. Screening cholesterol level: - Lipid panel to screen for high cholesterol.  - Lipid panel  6. Thyroid disorder screen: - TSH to check thyroid function.  - TSH  7. Need for hepatitis C screening test: - Hepatitis C antibody to screen for hepatitis C.  - Hepatitis C Antibody  8. Pap smear for cervical cancer screening: 9. Routine screening for STI (sexually transmitted infection): 10. IUD (intrauterine device) in place: 11. Irregular menses: - Per patient request referral to Obstetrics / Gynecology for cervical cancer screening by PAP smear.  - Referral to Obstetrics / Gynecology for further evaluation and management of irregular menses and IUD.  - Ambulatory referral to Gynecology  12. Language barrier: - [employer] participated during today's appointment. Interpreter Name: Suzzanne Cloud, ID#: [id number].   Patient was given the opportunity to ask questions.  Patient verbalized understanding of the plan and was able to repeat key elements of the plan. Patient was given clear instructions to go to Emergency Department or return to medical center if symptoms don't improve, worsen, or new problems develop.The patient verbalized understanding.   Orders Placed This Encounter  Procedures   Hepatitis C Antibody   CBC    Lipid panel   TSH   CMP14+EGFR   Hemoglobin A1c   Ambulatory referral to Gynecology    Return in about 1 year (around 07/04/2022) for Physical per patient preference.  Camillia Herter, NP

## 2021-07-04 ENCOUNTER — Other Ambulatory Visit: Payer: Self-pay

## 2021-07-04 ENCOUNTER — Encounter: Payer: Self-pay | Admitting: Family

## 2021-07-04 ENCOUNTER — Ambulatory Visit (INDEPENDENT_AMBULATORY_CARE_PROVIDER_SITE_OTHER): Payer: 59 | Admitting: Family

## 2021-07-04 VITALS — BP 138/80 | HR 72 | Temp 98.5°F | Ht 66.0 in | Wt 177.8 lb

## 2021-07-04 DIAGNOSIS — Z124 Encounter for screening for malignant neoplasm of cervix: Secondary | ICD-10-CM

## 2021-07-04 DIAGNOSIS — Z113 Encounter for screening for infections with a predominantly sexual mode of transmission: Secondary | ICD-10-CM

## 2021-07-04 DIAGNOSIS — N926 Irregular menstruation, unspecified: Secondary | ICD-10-CM | POA: Diagnosis not present

## 2021-07-04 DIAGNOSIS — Z13228 Encounter for screening for other metabolic disorders: Secondary | ICD-10-CM

## 2021-07-04 DIAGNOSIS — Z789 Other specified health status: Secondary | ICD-10-CM | POA: Diagnosis not present

## 2021-07-04 DIAGNOSIS — Z131 Encounter for screening for diabetes mellitus: Secondary | ICD-10-CM

## 2021-07-04 DIAGNOSIS — Z0001 Encounter for general adult medical examination with abnormal findings: Secondary | ICD-10-CM

## 2021-07-04 DIAGNOSIS — Z603 Acculturation difficulty: Secondary | ICD-10-CM

## 2021-07-04 DIAGNOSIS — Z0000 Encounter for general adult medical examination without abnormal findings: Secondary | ICD-10-CM

## 2021-07-04 DIAGNOSIS — Z975 Presence of (intrauterine) contraceptive device: Secondary | ICD-10-CM

## 2021-07-04 DIAGNOSIS — Z13220 Encounter for screening for lipoid disorders: Secondary | ICD-10-CM

## 2021-07-04 DIAGNOSIS — Z1159 Encounter for screening for other viral diseases: Secondary | ICD-10-CM

## 2021-07-04 DIAGNOSIS — Z1329 Encounter for screening for other suspected endocrine disorder: Secondary | ICD-10-CM

## 2021-07-04 DIAGNOSIS — Z130 Encounter for screening for diseases of the blood and blood-forming organs and certain disorders involving the immune mechanism: Secondary | ICD-10-CM

## 2021-07-04 NOTE — Patient Instructions (Signed)
Soins prventifs pour [employer] ges de [place] ans Preventive Care 71-37 Years Old, Female Les soins prventifs dsignent les choix de mode de vie et les visites chez votre prestataire de soins de sant qui peuvent favoriser votre sant et votre bien-tre. Par exemple : Un examen clinique annuel. C'est ce qu'on appelle galement un bilan de sant annuel. Des examens dentaires et oculaires rguliers. Des vaccinations. Des dpistages pour certaines affections. Des choix de mode de vie sain, comme : Adopter une alimentation saine. Pratiquer une activit physique rgulire. Ne pas consommer de drogues ni de produits contenant de la nicotine ou du tabac. Limiter sa consommation d'alcool. Que puis-je attendre de ma visite de soins prventifs ? Examen physique Votre prestataire de soins de sant pourra vrifier votre : Taille et poids. Ceux-ci pourront permettre de calculer votre indice de masse corporelle [facility] [employer]). [place] est une mesure qui indique si votre poids est un poids sain. Frquence cardiaque et tension artrielle. Temprature. Peau pour dceler d'ventuelles taches anormales. Consultation Votre prestataire de soins de sant pourra vous poser des questions concernant : Les problmes de sant que vous avez eus. Vos antcdents mdicaux familiaux. Votre consommation d'alcool, de tabac et de drogues. Votre bien-tre motionnel. Votre vie de famille et votre bien-tre relationnel. Votre activit sexuelle. Votre rgime alimentaire, vos activits sportives et vos habitudes de sommeil. Votre travail et votre environnement de [place]. Votre accs aux armes  feu. La mthode de contrle des naissances que vous Deforest Hoyles. Votre cycle menstruel. Vos antcdents de [place]. De quelles vaccinations ai-je besoin ? Les vaccins sont gnralement administrs  diffrents ges, selon un calendrier tabli. Votre prestataire de soins de sant vous recommandera des vaccins selon votre ge, vos  antcdents mdicaux, votre mode de vie ou d'autres facteurs, par [employer], l'endroit o vous travaillez ou un voyage que vous auriez prvu. De quels tests ai-je besoin ? Analyses de sang Taux de lipides et de cholestrol. [place] peuvent tre vrifis tous les 5 ans  partir de l'ge de 20 ans. Test de l'hpatite C. Test de l'hpatite B. Dpistage Test de dpistage du diabte. Ce dpistage est effectu en mesurant votre taux de sucre sanguin (glucose)  un moment o vous n'avez pas mang depuis un certain temps ( jeun). Tests de dpistage des MST (maladies sexuellement transmissibles), si vous prsentez un risque. Dpistage du cancer li aux gnes BRCA. Il peut tre effectu si vous avez des antcdents familiaux de cancer du sein, des [place], des trompes ou du pritoine. Examen pelvien et frottis vaginal. Cet examen peut tre effectu tous les 3 ans  partir de l'ge de 93 ans.  partir de l'ge de 68 ans, cet examen peut tre effectu tous les 5 ans si le frottis vaginal est ralis en association avec un test de VPH. Discutez avec votre prestataire de soins de sant des rsultats des tests, des options de traitement et si ncessaire, de la ncessit d'effectuer d'autres tests. [place] les instructions suivantes  domicile : Alimentation et boissons  Adoptez une alimentation saine contenant des fruits et des lgumes frais, des crales compltes, des protines maigres et des produits laitiers  faible teneur en matires grasses. Prenez des supplments vitaminiques et minraux, conformment Forensic psychologist de votre prestataire de soins de sant. Ne buvez pas d'alcool si : Votre prestataire de soins de sant vous l'interdit. Vous tes enceinte, pourriez l'tre ou envisagez de dbuter [employer]. Si vous consommez de l'alcool : Limitez votre consommation  1 verre d'alcool par jour. Stann Ore conscience de la  quantit d'alcool contenue dans votre verre. Aux .-U., un verre correspond  une  bouteille de bire (355 ml [12 onces]),  un verre de vin (148 ml [5 onces]) ou  un verre  liqueur d'alcool fort (44 ml [1,5 once]). Mode de vie Prenez soin de vos dents et de vos gencives quotidiennement. Brossez-vous les dents avec un dentifrice fluor tous les matins et tous les soirs. Utilisez du fil dentaire une fois par jour. Restez active. Faites de l'exercice au moins 30 minutes par jour, au moins 5 jours par [employer]. N'utilisez pas de [employer] tabac ou de la nicotine, tels que les cigarettes, les cigarettes lectroniques et Musician. Si vous avez besoin d'aide pour arrter de fumer, demandez conseil  votre prestataire de soins de sant. Ne consommez pas de drogues. Si vous tes sexuellement active, ayez des rapports sexuels protgs. Deforest Hoyles un prservatif ou une autre forme de protection pour viter les IST (infections sexuellement transmissibles). Si vous ne souhaitez pas dbuter [employer], utilisez une mthode de [employer]. Si vous souhaitez dbuter [employer], rendez-vous chez votre prestataire de soins de sant pour [employer]. Trouvez des [employer] de grer votre stress, comme : Archivist, le yoga ou couter de la musique. Tenir un journal. Rich Reining personne de confiance. Passer du [employer] ses amis et sa famille. [place] toujours votre ceinture de scurit lorsque vous conduisez ou lorsque vous tes  bord d'un vhicule. Ne conduisez pas : Si vous avez bu de l'alcool. Ne montez pas  bord d'un vhicule conduit par [employer] a bu. Lorsque vous tes fatigue ou distraite. En tapant un message. [place] un casque et d'autres quipements de protection lors de la pratique d'activits sportives. Si vous avez des armes  feu dans votre maison, assurez-vous de Cabin crew toutes les mesures relatives  l'utilisation des armes. [place] de l'aide si vous avez t victime de violences  corporelles ou sexuelles. Quelle est la prochaine tape ? Consultez votre prestataire de soins de sant une fois par an pour un bilan de sant. Demandez  votre prestataire de soins de sant  quelle frquence vous devriez Photographer vos yeux et vos dents. Veillez  tre  jour de tous vos vaccins. Ces conseils et renseignements ne sauraient se substituer  l'avis mdical de votre prestataire de soins de sant. Par consquent, il est primordial de parler de toutes vos proccupations avec votre prestataire de soins de sant. Document Revised: 07/10/2020 Document Reviewed: 06/28/2018 Elsevier Patient Education  2022 [employer].

## 2021-07-04 NOTE — Progress Notes (Signed)
Pt takes states she takes birthcontrol which causes her cycle to be irregular. Would like to know if this is normal. States Bc was prescribed by hospital. Not seen in chart

## 2021-07-05 LAB — CBC
Hematocrit: 42.2 % (ref 34.0–46.6)
Hemoglobin: 13.4 g/dL (ref 11.1–15.9)
MCH: 26 pg — ABNORMAL LOW (ref 26.6–33.0)
MCHC: 31.8 g/dL (ref 31.5–35.7)
MCV: 82 fL (ref 79–97)
Platelets: 269 10*3/uL (ref 150–450)
RBC: 5.15 x10E6/uL (ref 3.77–5.28)
RDW: 12.7 % (ref 11.7–15.4)
WBC: 4.4 10*3/uL (ref 3.4–10.8)

## 2021-07-05 LAB — CMP14+EGFR
ALT: 17 IU/L (ref 0–32)
AST: 21 IU/L (ref 0–40)
Albumin/Globulin Ratio: 1.8 (ref 1.2–2.2)
Albumin: 4.7 g/dL (ref 3.8–4.8)
Alkaline Phosphatase: 74 IU/L (ref 44–121)
BUN/Creatinine Ratio: 18 (ref 9–23)
BUN: 13 mg/dL (ref 6–20)
Bilirubin Total: 0.6 mg/dL (ref 0.0–1.2)
CO2: 24 mmol/L (ref 20–29)
Calcium: 9.6 mg/dL (ref 8.7–10.2)
Chloride: 103 mmol/L (ref 96–106)
Creatinine, Ser: 0.71 mg/dL (ref 0.57–1.00)
Globulin, Total: 2.6 g/dL (ref 1.5–4.5)
Glucose: 84 mg/dL (ref 70–99)
Potassium: 4.1 mmol/L (ref 3.5–5.2)
Sodium: 140 mmol/L (ref 134–144)
Total Protein: 7.3 g/dL (ref 6.0–8.5)
eGFR: 112 mL/min/{1.73_m2} (ref >59)

## 2021-07-05 LAB — LIPID PANEL
Chol/HDL Ratio: 2.8 ratio (ref 0.0–4.4)
Cholesterol, Total: 165 mg/dL (ref 100–199)
HDL: 60 mg/dL (ref >39)
LDL Chol Calc (NIH): 94 mg/dL (ref 0–99)
Triglycerides: 54 mg/dL (ref 0–149)
VLDL Cholesterol Cal: 11 mg/dL (ref 5–40)

## 2021-07-05 LAB — HEPATITIS C ANTIBODY: Hep C Virus Ab: <0.1 s/co ratio (ref 0.0–0.9)

## 2021-07-05 LAB — HEMOGLOBIN A1C
Est. average glucose Bld gHb Est-mCnc: 114 mg/dL
Hgb A1c MFr Bld: 5.6 % (ref 4.8–5.6)

## 2021-07-05 LAB — TSH: TSH: 2.52 u[IU]/mL (ref 0.450–4.500)

## 2021-07-05 NOTE — Progress Notes (Signed)
Kidney function normal.   Liver function normal.   Thyroid function normal.   Cholesterol normal.  No diabetes.   No anemia.   Hepatitis C negative.

## 2022-06-30 NOTE — Progress Notes (Signed)
Patient ID: Veronica Garrett, female    DOB: May 17, 1986  MRN: [id number]  CC: Annual Physical Exam  Subjective: Veronica Garrett is a 36 y.o. female who presents for annual physical exam.   Her concerns today include:  Reports she never heard from Gynecology referral since last annual physical exam in October 2022. Her IUD is due to be removed soon. Still having irregular menses sometimes. Needs refills on Ibuprofen 600 mg.   Patient Active Problem List   Diagnosis Date Noted   History of shoulder dystocia in prior pregnancy 12/18/2017   Language barrier 12/18/2017   Post-dates pregnancy 12/17/2017   NSVD (normal spontaneous vaginal delivery) 02/25/2016   Post term pregnancy 02/22/2016     No current outpatient medications on file prior to visit.   No current facility-administered medications on file prior to visit.    No Known Allergies  Social History   Socioeconomic History   Marital status: Married    Spouse name: Not on file   Number of children: Not on file   Years of education: Not on file   Highest education level: Not on file  Occupational History   Not on file  Tobacco Use   Smoking status: Never    Passive exposure: Never   Smokeless tobacco: Never  Vaping Use   Vaping Use: Never used  Substance and Sexual Activity   Alcohol use: No    Alcohol/week: 0.0 standard drinks of alcohol   Drug use: No   Sexual activity: Yes  Other Topics Concern   Not on file  Social History Narrative   Not on file   Social Determinants of Health   Financial Resource Strain: Not on file  Food Insecurity: Not on file  Transportation Needs: Not on file  Physical Activity: Not on file  Stress: Not on file  Social Connections: Not on file  Intimate Partner Violence: Not on file    Family History  Problem Relation Age of Onset   Alcohol abuse Neg Hx    Asthma Neg Hx    Arthritis Neg Hx    Birth defects Neg Hx    Cancer Neg Hx    COPD Neg Hx    Depression  Neg Hx    Diabetes Neg Hx    Drug abuse Neg Hx    Early death Neg Hx    Hearing loss Neg Hx    Heart disease Neg Hx    Hyperlipidemia Neg Hx    Hypertension Neg Hx    Kidney disease Neg Hx    Learning disabilities Neg Hx    Mental illness Neg Hx    Mental retardation Neg Hx    Miscarriages / Stillbirths Neg Hx    Vision loss Neg Hx    Varicose Veins Neg Hx    Stroke Neg Hx     No past surgical history on file.  ROS: Review of Systems Negative except as stated above  PHYSICAL EXAM: BP 111/69 (BP Location: Left Arm, Patient Position: Sitting, Cuff Size: Large)   Pulse 70   Temp 98.3 F (36.8 C)   Resp 16   Ht _0  (1.727 m)   Wt 181 lb (82.1 kg)   SpO2 98%   BMI 27.52 kg/m   Physical Exam HENT:     Head: Normocephalic and atraumatic.     Right Ear: Tympanic membrane, ear canal and external ear normal.     Left Ear: Tympanic membrane, ear canal and external ear normal.  Nose: Nose normal.     Mouth/Throat:     Mouth: Mucous membranes are moist.     Pharynx: Oropharynx is clear.  Eyes:     Extraocular Movements: Extraocular movements intact.     Conjunctiva/sclera: Conjunctivae normal.     Pupils: Pupils are equal, round, and reactive to light.  Cardiovascular:     Rate and Rhythm: Normal rate and regular rhythm.     Pulses: Normal pulses.     Heart sounds: Normal heart sounds.  Pulmonary:     Effort: Pulmonary effort is normal.     Breath sounds: Normal breath sounds.  Chest:     Comments: Patient declined.  Abdominal:     General: Bowel sounds are normal.     Palpations: Abdomen is soft.  Genitourinary:    Comments: Patient declined.  Musculoskeletal:        General: Normal range of motion.     Right shoulder: Normal.     Left shoulder: Normal.     Right upper arm: Normal.     Left upper arm: Normal.     Right elbow: Normal.     Left elbow: Normal.     Right forearm: Normal.     Left forearm: Normal.     Right wrist: Normal.     Left wrist:  Normal.     Right hand: Normal.     Left hand: Normal.     Cervical back: Normal, normal range of motion and neck supple.     Thoracic back: Normal.     Lumbar back: Normal.     Right hip: Normal.     Left hip: Normal.     Right upper leg: Normal.     Left upper leg: Normal.     Right knee: Normal.     Left knee: Normal.     Right lower leg: Normal.     Left lower leg: Normal.     Right ankle: Normal.     Left ankle: Normal.     Right foot: Normal.     Left foot: Normal.  Skin:    General: Skin is warm and dry.     Capillary Refill: Capillary refill takes less than 2 seconds.  Neurological:     General: No focal deficit present.     Mental Status: She is alert and oriented to person, place, and time.  Psychiatric:        Mood and Affect: Mood normal.        Behavior: Behavior normal.    ASSESSMENT AND PLAN: 1. Annual physical exam - Counseled on 150 minutes of exercise per week as tolerated, healthy eating (including decreased daily intake of saturated fats, cholesterol, added sugars, sodium), STI prevention, and routine healthcare maintenance.  2. Screening for metabolic disorder - Routine screening.  - CMP14+EGFR  3. Screening for deficiency anemia - Routine screening.  - CBC  4. Diabetes mellitus screening - Routine screening.  - Hemoglobin A1c  5. Screening cholesterol level - Routine screening.  - Lipid panel  6. Thyroid disorder screen - Routine screening.  - TSH  7. Pap smear for cervical cancer screening 8. Routine screening for STI (sexually transmitted infection) 9. IUD (intrauterine device) in place 10. Irregular menses - Continue Ibuprofen as prescribed.  - Patient states she never received call from Gynecology referral ordered during 07/04/2021 physical exam. New Gynecology referral ordered today. Message sent to referral coordinator, Maren Reamer, to see if patient can be scheduled soon. -  Ambulatory referral to Gynecology - ibuprofen (ADVIL) 600  MG tablet; Take 1 tablet (600 mg total) by mouth every 8 (eight) hours as needed.  Dispense: 30 tablet; Refill: 2  11. Language barrier - Patient speaking fluent English.   Patient was given the opportunity to ask questions.  Patient verbalized understanding of the plan and was able to repeat key elements of the plan. Patient was given clear instructions to go to Emergency Department or return to medical center if symptoms don't improve, worsen, or new problems develop.The patient verbalized understanding.   Orders Placed This Encounter  Procedures   CBC   Lipid panel   TSH   CMP14+EGFR   Hemoglobin A1c   Ambulatory referral to Gynecology     Requested Prescriptions   Signed Prescriptions Disp Refills   ibuprofen (ADVIL) 600 MG tablet 30 tablet 2    Sig: Take 1 tablet (600 mg total) by mouth every 8 (eight) hours as needed.    Return in about 1 year (around 07/11/2023) for Physical per patient preference.  Camillia Herter, NP

## 2022-07-10 ENCOUNTER — Ambulatory Visit (INDEPENDENT_AMBULATORY_CARE_PROVIDER_SITE_OTHER): Payer: Commercial Managed Care - HMO | Admitting: Family

## 2022-07-10 ENCOUNTER — Encounter: Payer: Self-pay | Admitting: Family

## 2022-07-10 ENCOUNTER — Telehealth: Payer: Self-pay | Admitting: Family

## 2022-07-10 VITALS — BP 111/69 | HR 70 | Temp 98.3°F | Resp 16 | Ht 68.0 in | Wt 181.0 lb

## 2022-07-10 DIAGNOSIS — Z13220 Encounter for screening for lipoid disorders: Secondary | ICD-10-CM

## 2022-07-10 DIAGNOSIS — Z131 Encounter for screening for diabetes mellitus: Secondary | ICD-10-CM

## 2022-07-10 DIAGNOSIS — Z130 Encounter for screening for diseases of the blood and blood-forming organs and certain disorders involving the immune mechanism: Secondary | ICD-10-CM

## 2022-07-10 DIAGNOSIS — Z13228 Encounter for screening for other metabolic disorders: Secondary | ICD-10-CM | POA: Diagnosis not present

## 2022-07-10 DIAGNOSIS — Z758 Other problems related to medical facilities and other health care: Secondary | ICD-10-CM

## 2022-07-10 DIAGNOSIS — N926 Irregular menstruation, unspecified: Secondary | ICD-10-CM

## 2022-07-10 DIAGNOSIS — Z0000 Encounter for general adult medical examination without abnormal findings: Secondary | ICD-10-CM | POA: Diagnosis not present

## 2022-07-10 DIAGNOSIS — Z975 Presence of (intrauterine) contraceptive device: Secondary | ICD-10-CM

## 2022-07-10 DIAGNOSIS — Z124 Encounter for screening for malignant neoplasm of cervix: Secondary | ICD-10-CM

## 2022-07-10 DIAGNOSIS — Z789 Other specified health status: Secondary | ICD-10-CM

## 2022-07-10 DIAGNOSIS — Z113 Encounter for screening for infections with a predominantly sexual mode of transmission: Secondary | ICD-10-CM

## 2022-07-10 DIAGNOSIS — Z1329 Encounter for screening for other suspected endocrine disorder: Secondary | ICD-10-CM

## 2022-07-10 MED ORDER — IBUPROFEN 600 MG PO TABS: 600.0000 mg | ORAL_TABLET | Freq: Three times a day (TID) | ORAL | 2 refills | Status: AC | PRN

## 2022-07-10 NOTE — Progress Notes (Signed)
.  Pt presents for annual physical exam   -pt would like a refill on Ibuprofen 600mg   -needs new referral to gyn last referral over year ago, pt never contacted

## 2022-07-10 NOTE — Patient Instructions (Signed)
Soins prventifs pour [employer] ges de [place] ans Preventive Care 70-38 Years Old, Female Les soins prventifs dsignent les choix de mode de vie et les visites chez votre prestataire de soins de sant qui peuvent favoriser votre sant et votre bien-tre. Les visites de soins prventifs sont galement appeles  bilans de sant . Que puis-je attendre de ma visite de soins prventifs ? Consultation Lors de votre visite de soins prventifs, votre prestataire de soins de sant pourra vous poser des questions sur : Vos antcdents mdicaux, notamment : Les problmes de sant que vous avez eus. Les antcdents mdicaux de votre famille. Vos antcdents de [place]. Votre tat de sant au moment de la visite, notamment : Votre cycle menstruel. La mthode de contrle des naissances que vous Deforest Hoyles. Votre bien-tre motionnel. Votre vie de famille et votre bien-tre relationnel. Votre activit et votre sant sexuelles. Votre mode de vie, notamment : Votre consommation d'alcool, de nicotine ou de tabac et de drogues. Votre accs aux armes  feu. Votre rgime alimentaire, vos activits sportives et vos habitudes de sommeil. Votre travail et votre environnement de [place]. L'utilisation que vous faites de la crme [employer]. Les [employer] de scurit que vous prenez, Proofreader port de la ceinture de scurit ou le port d'un casque lorsque vous faites du vlo. Examen physique Votre prestataire de soins de sant pourra vrifier : Votre taille et [employer]. Ceux-ci pourront permettre de calculer votre indice de masse corporelle [facility]). [place] est une mesure qui indique si votre poids est un poids sain. Votre tour de [place]. Il s'agit de la [employer] tour de Transport planner. Cette mesure indique galement si votre poids est correct et peut aider  valuer votre risque de dvelopper certaines maladies, comme le diabte de type 2 et l'hypertension artrielle. Votre frquence cardiaque et votre tension  artrielle. Votre temprature. Votre peau pour dceler d'ventuelles taches anormales. De quelles vaccinations ai-je besoin ?  Les vaccins sont gnralement administrs  diffrents ges, selon un calendrier tabli. Votre prestataire de soins de sant vous recommandera des vaccins selon votre ge, vos antcdents mdicaux, votre mode de vie ou d'autres facteurs, par [employer], l'endroit o vous travaillez ou un voyage que vous auriez prvu. De quels tests ai-je besoin ? Dpistage Votre prestataire de soins de sant pourrait vous recommander de passer des tests de dpistage pour [employer]. Notamment : Un examen pelvien et un frottis vaginal. L'analyse des taux de lipides et de cholestrol. Un test de dpistage du diabte. Ce dpistage est effectu en mesurant votre taux de sucre sanguin (glucose)  un moment o vous n'avez pas mang depuis un certain temps ( jeun). Un test de l'hpatite B. Un test de l'hpatite C. Un test de dpistage du VIH (virus de l'immunodficience humaine). Des tests de dpistage des IST (infections sexuellement transmissibles), si vous prsentez un risque. Un test de dpistage du cancer li aux gnes BRCA. Il pourra tre effectu si vous avez des antcdents familiaux de cancer du sein, des [place], des trompes ou du pritoine. Discutez avec votre prestataire de soins de sant des rsultats des tests, des options de traitement et si ncessaire, de la ncessit d'effectuer d'autres tests. [place] les instructions suivantes  domicile : Alimentation et boissons  Adoptez une alimentation saine contenant des fruits et des lgumes frais, des crales compltes, des protines maigres et des produits laitiers  faible teneur en matires grasses. Prenez des supplments vitaminiques et minraux, conformment Forensic psychologist de votre prestataire de soins de sant.  Ne buvez pas d'alcool si : Votre prestataire de soins de sant vous l'interdit. Vous tes  enceinte, pourriez l'tre ou envisagez de dbuter [employer]. Si vous consommez de l'alcool : Limitez votre consommation  1 verre d'alcool par jour. Sachez quelle quantit d'alcool est contenue dans votre verre. Aux .-U., un verre correspond  une bouteille de bire (355 ml [12 onces]),  un verre de vin (148 ml [5 onces]) ou  un verre  liqueur d'alcool fort (44 ml [1,5 once]). Mode de vie Brossez-vous les dents avec un dentifrice fluor tous les matins et tous les soirs. Utilisez du fil dentaire une fois par jour. Faites de l'exercice au moins 30 minutes par jour, au moins 5 jours par [employer]. N'utilisez pas de produits contenant de la nicotine ou du tabac. Il s'agit notamment des cigarettes, du tabac  mcher et des vapoteuses, comme les cigarettes lectroniques. Si vous avez besoin d'aide pour arrter de fumer, demandez conseil  votre prestataire de soins de sant. Ne consommez pas de drogues. Si vous tes sexuellement active, ayez des rapports sexuels protgs. Deforest Hoyles un prservatif ou une autre forme de protection afin d'viter les IST. Si vous ne souhaitez pas dbuter [employer], utilisez une mthode de [employer]. Si vous souhaitez dbuter [employer], rendez-vous chez votre prestataire de soins de sant pour [employer]. Trouvez des solutions saines pour grer votre stress, comme : Archivist, le yoga ou couter de la musique. Tenir un journal. Rich Reining personne de confiance. Passer du [employer] ses amis et sa famille. Pour rduire [employer] de dvelopper un cancer de la peau, vitez de vous exposer aux rayons UV. Sc[place]it [place] toujours votre ceinture de scurit lorsque vous conduisez ou lorsque vous tes  bord d'un vhicule. Ne conduisez pas : Si vous avez bu de l'alcool. Ne montez pas  bord d'un vhicule conduit par [employer] a bu. Si vous avez consomm des substances ou pris des [employer]  altrent votre tat d'esprit. En tapant un message. Lorsque vous tes fatigue ou distraite. Portez un casque ou tout autre quipement de Primary school teacher de la pratique d'activits sportives. Si vous avez des armes  feu dans votre maison, assurez-vous de Cabin crew toutes les mesures relatives  l'utilisation des armes. [place] de l'aide si vous avez t victime de violences corporelles ou sexuelles. Quelle est la prochaine tape ? Consultez votre prestataire de soins de sant une fois par an pour un bilan de sant. [place]andez  votre prestataire de soins de sant  quelle frquence vous devriez Photographer vos yeux et vos dents. Veillez  tre  jour de tous vos vaccins. Ces conseils et renseignements ne sauraient se substituer  l'avis mdical de votre prestataire de soins de sant. Par consquent, il est primordial de parler de toutes vos proccupations avec votre prestataire de soins de sant. Document Revised: 04/15/2021 Document Reviewed: 04/04/2021 Elsevier Patient Education  [place].

## 2022-07-11 ENCOUNTER — Other Ambulatory Visit: Payer: Self-pay | Admitting: Family

## 2022-07-11 DIAGNOSIS — R7401 Elevation of levels of liver transaminase levels: Secondary | ICD-10-CM

## 2022-07-11 LAB — CBC
Hematocrit: 46.1 % (ref 34.0–46.6)
Hemoglobin: 14.5 g/dL (ref 11.1–15.9)
MCH: 26.5 pg — ABNORMAL LOW (ref 26.6–33.0)
MCHC: 31.5 g/dL (ref 31.5–35.7)
MCV: 84 fL (ref 79–97)
Platelets: 321 10*3/uL (ref 150–450)
RBC: 5.48 x10E6/uL — ABNORMAL HIGH (ref 3.77–5.28)
RDW: 12.9 % (ref 11.7–15.4)
WBC: 5.4 10*3/uL (ref 3.4–10.8)

## 2022-07-11 LAB — CMP14+EGFR
ALT: 38 IU/L — ABNORMAL HIGH (ref 0–32)
AST: 32 IU/L (ref 0–40)
Albumin/Globulin Ratio: 1.8 (ref 1.2–2.2)
Albumin: 4.8 g/dL (ref 3.9–4.9)
Alkaline Phosphatase: 81 IU/L (ref 44–121)
BUN/Creatinine Ratio: 17 (ref 9–23)
BUN: 12 mg/dL (ref 6–20)
Bilirubin Total: 0.4 mg/dL (ref 0.0–1.2)
CO2: 24 mmol/L (ref 20–29)
Calcium: 10 mg/dL (ref 8.7–10.2)
Chloride: 100 mmol/L (ref 96–106)
Creatinine, Ser: 0.72 mg/dL (ref 0.57–1.00)
Globulin, Total: 2.7 g/dL (ref 1.5–4.5)
Glucose: 71 mg/dL (ref 70–99)
Potassium: 4.6 mmol/L (ref 3.5–5.2)
Sodium: 137 mmol/L (ref 134–144)
Total Protein: 7.5 g/dL (ref 6.0–8.5)
eGFR: 110 mL/min/{1.73_m2} (ref >59)

## 2022-07-11 LAB — LIPID PANEL
Chol/HDL Ratio: 2.9 ratio (ref 0.0–4.4)
Cholesterol, Total: 174 mg/dL (ref 100–199)
HDL: 61 mg/dL (ref >39)
LDL Chol Calc (NIH): 98 mg/dL (ref 0–99)
Triglycerides: 79 mg/dL (ref 0–149)
VLDL Cholesterol Cal: 15 mg/dL (ref 5–40)

## 2022-07-11 LAB — HEMOGLOBIN A1C
Est. average glucose Bld gHb Est-mCnc: 114 mg/dL
Hgb A1c MFr Bld: 5.6 % (ref 4.8–5.6)

## 2022-07-11 LAB — TSH: TSH: 0.998 u[IU]/mL (ref 0.450–4.500)

## 2022-08-31 ENCOUNTER — Other Ambulatory Visit (HOSPITAL_COMMUNITY)
Admission: RE | Admit: 2022-08-31 | Discharge: 2022-08-31 | Disposition: A | Discharge status: Home / Self Care | Payer: Commercial Managed Care - HMO | Source: Ambulatory Visit | Attending: Obstetrics and Gynecology | Admitting: Obstetrics and Gynecology

## 2022-08-31 ENCOUNTER — Encounter: Payer: Self-pay | Admitting: Obstetrics and Gynecology

## 2022-08-31 ENCOUNTER — Ambulatory Visit (INDEPENDENT_AMBULATORY_CARE_PROVIDER_SITE_OTHER): Payer: Commercial Managed Care - HMO | Admitting: Obstetrics and Gynecology

## 2022-08-31 VITALS — BP 143/80 | HR 82 | Ht 68.0 in | Wt 186.2 lb

## 2022-08-31 DIAGNOSIS — T8332XA Displacement of intrauterine contraceptive device, initial encounter: Secondary | ICD-10-CM | POA: Diagnosis not present

## 2022-08-31 DIAGNOSIS — Z01419 Encounter for gynecological examination (general) (routine) without abnormal findings: Secondary | ICD-10-CM | POA: Insufficient documentation

## 2022-08-31 NOTE — Progress Notes (Signed)
Subjective:     Veronica Garrett is a 36 y.o. female P2 with levonorgestrel IUD induced amenorrhea and BMI 28 who is here for a comprehensive physical exam. The patient reports no problems. She is sexually active without complaints. She denies pelvic pain or abnormal discharge. [facility] and has not had a pelvic exam since insertion.   History reviewed. No pertinent past medical history. History reviewed. No pertinent surgical history. Family History  Problem Relation Age of Onset   Alcohol abuse Neg Hx    Asthma Neg Hx    Arthritis Neg Hx    Birth defects Neg Hx    Cancer Neg Hx    COPD Neg Hx    Depression Neg Hx    Diabetes Neg Hx    Drug abuse Neg Hx    Early death Neg Hx    Hearing loss Neg Hx    Heart disease Neg Hx    Hyperlipidemia Neg Hx    Hypertension Neg Hx    Kidney disease Neg Hx    Learning disabilities Neg Hx    Mental illness Neg Hx    Mental retardation Neg Hx    Miscarriages / Stillbirths Neg Hx    Vision loss Neg Hx    Varicose Veins Neg Hx    Stroke Neg Hx    Social History   Socioeconomic History   Marital status: Married    Spouse name: Not on file   Number of children: Not on file   Years of education: Not on file   Highest education level: Not on file  Occupational History   Not on file  Tobacco Use   Smoking status: Never    Passive exposure: Never   Smokeless tobacco: Never  Vaping Use   Vaping Use: Never used  Substance and Sexual Activity   Alcohol use: No    Alcohol/week: 0.0 standard drinks of alcohol   Drug use: No   Sexual activity: Yes    Birth control/protection: I.U.D.  Other Topics Concern   Not on file  Social History Narrative   Not on file   Social Determinants of Health   Financial Resource Strain: Not on file  Food Insecurity: Not on file  Transportation Needs: Not on file  Physical Activity: Not on file  Stress: Not on file  Social Connections: Not on file  Intimate  Partner Violence: Not on file   Health Maintenance  Topic Date Due   DTaP/Tdap/Td (1 - Tdap) Never done   PAP SMEAR-Modifier  Never done   INFLUENZA VACCINE  12/27/2022 (Originally 04/28/2022)   Hepatitis C Screening  Completed   HIV Screening  Completed   HPV VACCINES  Aged Out       Review of Systems Pertinent items noted in HPI and remainder of comprehensive ROS otherwise negative.   Objective:  Blood pressure (!) 143/80, pulse 82, height 5\' 8"  (1.727 m), weight 186 lb 3.2 oz (84.5 kg).   GENERAL: Well-developed, well-nourished female in no acute distress.  HEENT: Normocephalic, atraumatic. Sclerae anicteric.  NECK: Supple. Normal thyroid.  LUNGS: Clear to auscultation bilaterally.  HEART: Regular rate and rhythm. BREASTS: Symmetric in size. No palpable masses or lymphadenopathy, skin changes, or nipple drainage. ABDOMEN: Soft, nontender, nondistended. No organomegaly. PELVIC: Normal external female genitalia. Vagina is pink and rugated.  Normal discharge. Normal appearing cervix. IUD strings not visualized at the os. Uterus is normal in size. No adnexal mass or tenderness. Chaperone present  during the pelvic exam EXTREMITIES: No cyanosis, clubbing, or edema, 2+ distal pulses.     Assessment:    Healthy female exam.      Plan:    Pap smear collected Vaginal swab collected Pelvic ultrasound ordered to assess IUD location Patient will be contacted with abnormal results See After Visit Summary for Counseling Recommendations

## 2022-08-31 NOTE — Progress Notes (Signed)
New Patient is in the office for annual. Pt reports that she currently has IUD inserted in 2019 at [facility] Pt is unsure which IUD she has but states that it is good for 5 years.

## 2022-09-01 LAB — CYTOLOGY - PAP
Comment: NEGATIVE
Diagnosis: NEGATIVE
High risk HPV: NEGATIVE

## 2022-09-01 LAB — HEPATITIS C ANTIBODY: Hep C Virus Ab: NONREACTIVE

## 2022-09-01 LAB — CERVICOVAGINAL ANCILLARY ONLY
Bacterial Vaginitis (gardnerella): NEGATIVE
Candida Glabrata: NEGATIVE
Candida Vaginitis: NEGATIVE
Chlamydia: NEGATIVE
Comment: NEGATIVE
Comment: NEGATIVE
Comment: NEGATIVE
Comment: NEGATIVE
Comment: NEGATIVE
Comment: NORMAL
Neisseria Gonorrhea: NEGATIVE
Trichomonas: NEGATIVE

## 2022-09-01 LAB — RPR: RPR Ser Ql: NONREACTIVE

## 2022-09-01 LAB — HEPATITIS B SURFACE ANTIGEN: Hepatitis B Surface Ag: NEGATIVE

## 2022-09-01 LAB — HIV ANTIBODY (ROUTINE TESTING W REFLEX): HIV Screen 4th Generation wRfx: NONREACTIVE

## 2022-09-04 ENCOUNTER — Ambulatory Visit (HOSPITAL_BASED_OUTPATIENT_CLINIC_OR_DEPARTMENT_OTHER)
Admission: RE | Admit: 2022-09-04 | Discharge: 2022-09-04 | Disposition: A | Discharge status: Home / Self Care | Payer: Commercial Managed Care - HMO | Source: Ambulatory Visit | Attending: Obstetrics and Gynecology | Admitting: Obstetrics and Gynecology

## 2022-09-04 DIAGNOSIS — T8332XA Displacement of intrauterine contraceptive device, initial encounter: Secondary | ICD-10-CM | POA: Diagnosis present

## 2022-09-07 ENCOUNTER — Telehealth: Payer: Self-pay | Admitting: Emergency Medicine

## 2022-09-07 NOTE — Telephone Encounter (Signed)
-----   Message from Catalina Antigua, MD sent at 09/07/2022  9:27 AM EST ----- Please inform patient that her IUD is in the appropriate location inside the uterus. We can remove it at its scheduled expiration time or sooner if planning a pregnancy  Dr. Caroll Rancher

## 2022-09-07 NOTE — Telephone Encounter (Signed)
TC to patient with [place] interpreter to discuss results. No other questions or concerns at this time.

## 2023-06-03 NOTE — Telephone Encounter (Signed)
See routing comment(s) for message information.
